# Patient Record
Sex: Male | Born: 1970
Health system: Southern US, Community
[De-identification: ages and names within clinical notes are randomized; demographics above are authoritative.]

## PROBLEM LIST (undated history)

## (undated) DIAGNOSIS — E785 Hyperlipidemia, unspecified: Secondary | ICD-10-CM

## (undated) DIAGNOSIS — B1910 Unspecified viral hepatitis B without hepatic coma: Secondary | ICD-10-CM

## (undated) DIAGNOSIS — E559 Vitamin D deficiency, unspecified: Secondary | ICD-10-CM

## (undated) DIAGNOSIS — K859 Acute pancreatitis without necrosis or infection, unspecified: Secondary | ICD-10-CM

## (undated) DIAGNOSIS — K219 Gastro-esophageal reflux disease without esophagitis: Secondary | ICD-10-CM

## (undated) DIAGNOSIS — D136 Benign neoplasm of pancreas: Secondary | ICD-10-CM

## (undated) HISTORY — DX: Unspecified viral hepatitis B without hepatic coma: B19.10

## (undated) HISTORY — DX: Vitamin D deficiency, unspecified: E55.9

## (undated) HISTORY — PX: OTHER SURGICAL HISTORY: SHX169

## (undated) HISTORY — PX: HERNIA REPAIR: SHX51

## (undated) HISTORY — PX: CHOLECYSTECTOMY: SHX55

---

## 2004-02-04 ENCOUNTER — Encounter: Admission: RE | Admit: 2004-02-04 | Discharge: 2004-02-04 | Payer: Self-pay | Admitting: Specialist

## 2005-05-18 ENCOUNTER — Ambulatory Visit: Payer: Self-pay | Admitting: Gastroenterology

## 2005-09-22 ENCOUNTER — Ambulatory Visit: Payer: Self-pay | Admitting: Gastroenterology

## 2005-09-24 ENCOUNTER — Ambulatory Visit (HOSPITAL_COMMUNITY): Admission: RE | Admit: 2005-09-24 | Discharge: 2005-09-24 | Payer: Self-pay | Admitting: Gastroenterology

## 2005-10-14 ENCOUNTER — Ambulatory Visit (HOSPITAL_COMMUNITY): Admission: RE | Admit: 2005-10-14 | Discharge: 2005-10-17 | Payer: Self-pay | Admitting: General Surgery

## 2006-01-01 ENCOUNTER — Ambulatory Visit: Payer: Self-pay | Admitting: Gastroenterology

## 2006-01-04 ENCOUNTER — Ambulatory Visit (HOSPITAL_COMMUNITY): Admission: RE | Admit: 2006-01-04 | Discharge: 2006-01-04 | Payer: Self-pay | Admitting: Gastroenterology

## 2006-07-04 ENCOUNTER — Encounter: Payer: Self-pay | Admitting: Gastroenterology

## 2006-07-26 ENCOUNTER — Ambulatory Visit: Payer: Self-pay | Admitting: Gastroenterology

## 2006-08-05 ENCOUNTER — Encounter: Admission: RE | Admit: 2006-08-05 | Discharge: 2006-08-05 | Payer: Self-pay | Admitting: Gastroenterology

## 2006-09-13 ENCOUNTER — Ambulatory Visit: Payer: Self-pay | Admitting: Gastroenterology

## 2007-07-05 ENCOUNTER — Ambulatory Visit: Payer: Self-pay | Admitting: Gastroenterology

## 2007-07-05 LAB — CONVERTED CEMR LAB
ALT: 24 units/L (ref 0–53)
AST: 22 units/L (ref 0–37)
Albumin: 4 g/dL (ref 3.5–5.2)
Alkaline Phosphatase: 74 units/L (ref 39–117)
Ammonia: 17 umol/L (ref 11–35)
BUN: 16 mg/dL (ref 6–23)
Basophils Absolute: 0 10*3/uL (ref 0.0–0.1)
Basophils Relative: 0.3 % (ref 0.0–1.0)
Bilirubin, Direct: 0.1 mg/dL (ref 0.0–0.3)
CO2: 31 meq/L (ref 19–32)
Calcium: 9.3 mg/dL (ref 8.4–10.5)
Chloride: 105 meq/L (ref 96–112)
Creatinine, Ser: 0.8 mg/dL (ref 0.4–1.5)
Eosinophils Absolute: 0.2 10*3/uL (ref 0.0–0.6)
Eosinophils Relative: 3 % (ref 0.0–5.0)
GFR calc Af Amer: 141 mL/min
GFR calc non Af Amer: 117 mL/min
Glucose, Bld: 104 mg/dL — ABNORMAL HIGH (ref 70–99)
HCT: 37.7 % — ABNORMAL LOW (ref 39.0–52.0)
Hemoglobin: 12.9 g/dL — ABNORMAL LOW (ref 13.0–17.0)
Lipase: 43 units/L (ref 11.0–59.0)
Lymphocytes Relative: 35.1 % (ref 12.0–46.0)
MCHC: 34.2 g/dL (ref 30.0–36.0)
MCV: 91.6 fL (ref 78.0–100.0)
Monocytes Absolute: 0.6 10*3/uL (ref 0.2–0.7)
Monocytes Relative: 8.1 % (ref 3.0–11.0)
Neutro Abs: 3.7 10*3/uL (ref 1.4–7.7)
Neutrophils Relative %: 53.5 % (ref 43.0–77.0)
Platelets: 331 10*3/uL (ref 150–400)
Potassium: 3.9 meq/L (ref 3.5–5.1)
RBC: 4.11 M/uL — ABNORMAL LOW (ref 4.22–5.81)
RDW: 11.2 % — ABNORMAL LOW (ref 11.5–14.6)
Sodium: 140 meq/L (ref 135–145)
TSH: 1.02 microintl units/mL (ref 0.35–5.50)
Total Bilirubin: 0.9 mg/dL (ref 0.3–1.2)
Total Protein: 7.1 g/dL (ref 6.0–8.3)
WBC: 7 10*3/uL (ref 4.5–10.5)

## 2007-07-07 ENCOUNTER — Encounter: Admission: RE | Admit: 2007-07-07 | Discharge: 2007-07-07 | Payer: Self-pay | Admitting: Gastroenterology

## 2007-07-13 ENCOUNTER — Ambulatory Visit: Payer: Self-pay | Admitting: Gastroenterology

## 2007-07-14 ENCOUNTER — Ambulatory Visit: Payer: Self-pay | Admitting: Gastroenterology

## 2007-07-20 ENCOUNTER — Ambulatory Visit (HOSPITAL_COMMUNITY): Admission: RE | Admit: 2007-07-20 | Discharge: 2007-07-20 | Payer: Self-pay | Admitting: Gastroenterology

## 2007-09-05 ENCOUNTER — Ambulatory Visit: Payer: Self-pay | Admitting: Gastroenterology

## 2007-11-30 ENCOUNTER — Encounter: Payer: Self-pay | Admitting: Gastroenterology

## 2007-12-16 ENCOUNTER — Encounter: Payer: Self-pay | Admitting: Gastroenterology

## 2008-01-24 ENCOUNTER — Encounter: Payer: Self-pay | Admitting: Gastroenterology

## 2008-02-08 ENCOUNTER — Encounter: Payer: Self-pay | Admitting: Gastroenterology

## 2008-03-30 DIAGNOSIS — K439 Ventral hernia without obstruction or gangrene: Secondary | ICD-10-CM

## 2008-05-15 ENCOUNTER — Telehealth: Payer: Self-pay | Admitting: Gastroenterology

## 2008-05-22 ENCOUNTER — Telehealth: Payer: Self-pay | Admitting: Gastroenterology

## 2008-07-06 ENCOUNTER — Ambulatory Visit: Payer: Self-pay | Admitting: Gastroenterology

## 2008-07-06 DIAGNOSIS — K859 Acute pancreatitis without necrosis or infection, unspecified: Secondary | ICD-10-CM | POA: Insufficient documentation

## 2008-07-06 DIAGNOSIS — K3184 Gastroparesis: Secondary | ICD-10-CM

## 2008-07-06 LAB — CONVERTED CEMR LAB
ALT: 39 units/L (ref 0–53)
AST: 28 units/L (ref 0–37)
Albumin: 4.2 g/dL (ref 3.5–5.2)
Alkaline Phosphatase: 82 units/L (ref 39–117)
Amylase: 50 units/L (ref 27–131)
BUN: 16 mg/dL (ref 6–23)
Basophils Absolute: 0 10*3/uL (ref 0.0–0.1)
Basophils Relative: 0.1 % (ref 0.0–1.0)
CO2: 30 meq/L (ref 19–32)
Calcium: 9.2 mg/dL (ref 8.4–10.5)
Chloride: 101 meq/L (ref 96–112)
Creatinine, Ser: 0.9 mg/dL (ref 0.4–1.5)
Eosinophils Absolute: 0.2 10*3/uL (ref 0.0–0.7)
Eosinophils Relative: 3.1 % (ref 0.0–5.0)
GFR calc Af Amer: 123 mL/min
GFR calc non Af Amer: 101 mL/min
Glucose, Bld: 165 mg/dL — ABNORMAL HIGH (ref 70–99)
HCT: 37.7 % — ABNORMAL LOW (ref 39.0–52.0)
Hemoglobin: 13 g/dL (ref 13.0–17.0)
Lymphocytes Relative: 34.3 % (ref 12.0–46.0)
MCHC: 34.4 g/dL (ref 30.0–36.0)
MCV: 92.7 fL (ref 78.0–100.0)
Monocytes Absolute: 0.5 10*3/uL (ref 0.1–1.0)
Monocytes Relative: 6.7 % (ref 3.0–12.0)
Neutro Abs: 4 10*3/uL (ref 1.4–7.7)
Neutrophils Relative %: 55.8 % (ref 43.0–77.0)
Platelets: 322 10*3/uL (ref 150–400)
Potassium: 3.9 meq/L (ref 3.5–5.1)
RBC: 4.06 M/uL — ABNORMAL LOW (ref 4.22–5.81)
RDW: 11.1 % — ABNORMAL LOW (ref 11.5–14.6)
Sodium: 138 meq/L (ref 135–145)
Total Bilirubin: 0.8 mg/dL (ref 0.3–1.2)
Total Protein: 7 g/dL (ref 6.0–8.3)
WBC: 7.2 10*3/uL (ref 4.5–10.5)

## 2008-07-10 ENCOUNTER — Telehealth (INDEPENDENT_AMBULATORY_CARE_PROVIDER_SITE_OTHER): Payer: Self-pay | Admitting: *Deleted

## 2008-07-12 ENCOUNTER — Telehealth: Payer: Self-pay | Admitting: Gastroenterology

## 2008-07-24 ENCOUNTER — Telehealth: Payer: Self-pay | Admitting: Gastroenterology

## 2008-07-24 ENCOUNTER — Ambulatory Visit (HOSPITAL_COMMUNITY): Admission: RE | Admit: 2008-07-24 | Discharge: 2008-07-24 | Payer: Self-pay | Admitting: Gastroenterology

## 2008-09-17 ENCOUNTER — Telehealth: Payer: Self-pay | Admitting: Gastroenterology

## 2009-02-21 ENCOUNTER — Telehealth: Payer: Self-pay | Admitting: Gastroenterology

## 2009-06-17 ENCOUNTER — Encounter: Payer: Self-pay | Admitting: Gastroenterology

## 2009-08-05 ENCOUNTER — Ambulatory Visit: Payer: Self-pay | Admitting: Gastroenterology

## 2009-08-05 DIAGNOSIS — R945 Abnormal results of liver function studies: Secondary | ICD-10-CM | POA: Insufficient documentation

## 2009-08-05 LAB — CONVERTED CEMR LAB
Anti Nuclear Antibody(ANA): NEGATIVE
Ceruloplasmin: 43 mg/dL (ref 21–63)
Ferritin: 507.4 ng/mL — ABNORMAL HIGH (ref 22.0–322.0)
HCV Ab: NEGATIVE
Hep B S Ab: POSITIVE — AB
Hepatitis B Surface Ag: NEGATIVE
INR: 1.1 — ABNORMAL HIGH (ref 0.8–1.0)
Iron: 85 ug/dL (ref 42–165)
Prothrombin Time: 11.2 s (ref 9.1–11.7)
Saturation Ratios: 26.2 % (ref 20.0–50.0)
Transferrin: 231.9 mg/dL (ref 212.0–360.0)
aPTT: 30.5 s — ABNORMAL HIGH (ref 21.7–28.8)

## 2009-08-06 ENCOUNTER — Telehealth: Payer: Self-pay | Admitting: Gastroenterology

## 2009-08-07 ENCOUNTER — Telehealth: Payer: Self-pay | Admitting: Gastroenterology

## 2009-08-12 ENCOUNTER — Encounter: Payer: Self-pay | Admitting: Gastroenterology

## 2009-08-15 ENCOUNTER — Ambulatory Visit: Payer: Self-pay | Admitting: Cardiology

## 2009-08-16 ENCOUNTER — Ambulatory Visit: Payer: Self-pay | Admitting: Gastroenterology

## 2009-11-18 ENCOUNTER — Telehealth: Payer: Self-pay | Admitting: Gastroenterology

## 2009-11-25 ENCOUNTER — Ambulatory Visit: Payer: Self-pay | Admitting: Gastroenterology

## 2009-11-26 LAB — CONVERTED CEMR LAB
ALT: 102 units/L — ABNORMAL HIGH (ref 0–53)
AST: 59 units/L — ABNORMAL HIGH (ref 0–37)
Albumin: 4.2 g/dL (ref 3.5–5.2)
Alkaline Phosphatase: 80 units/L (ref 39–117)
Bilirubin, Direct: 0.1 mg/dL (ref 0.0–0.3)
INR: 1.1 — ABNORMAL HIGH (ref 0.8–1.0)
Prothrombin Time: 11.5 s (ref 9.1–11.7)
Total Bilirubin: 1.1 mg/dL (ref 0.3–1.2)
Total Protein: 7.5 g/dL (ref 6.0–8.3)

## 2009-12-30 ENCOUNTER — Telehealth: Payer: Self-pay | Admitting: Gastroenterology

## 2010-01-09 ENCOUNTER — Encounter: Payer: Self-pay | Admitting: Gastroenterology

## 2010-01-10 ENCOUNTER — Ambulatory Visit: Payer: Self-pay | Admitting: Gastroenterology

## 2010-01-10 LAB — CONVERTED CEMR LAB
ALT: 31 units/L (ref 0–53)
AST: 27 units/L (ref 0–37)
Albumin: 4.2 g/dL (ref 3.5–5.2)
Alkaline Phosphatase: 82 units/L (ref 39–117)
Bilirubin, Direct: 0.1 mg/dL (ref 0.0–0.3)
Indirect Bilirubin: 0.3 mg/dL (ref 0.0–0.9)
Total Bilirubin: 0.4 mg/dL (ref 0.3–1.2)
Total Protein: 7.5 g/dL (ref 6.0–8.3)

## 2010-01-14 ENCOUNTER — Encounter: Payer: Self-pay | Admitting: Gastroenterology

## 2010-03-05 ENCOUNTER — Telehealth: Payer: Self-pay | Admitting: Gastroenterology

## 2010-05-28 IMAGING — CT CT ABDOMEN W/O CM
2 of 4 series · 17 of 46 positions shown, 19 images · non-contrast
Comparison: No prior CT.

CLINICAL DATA: Elevated liver function tests.  History
pancreatitis and pancreatic pseudocyst.  IV contrast allergy.

CT ABDOMEN WITHOUT CONTRAST
TECHNIQUE: Multidetector CT imaging of the abdomen was performed
following the standard protocol without IV contrast.

[Series 2: ap without · axial · non-contrast · 0.68mm/px · z∈[-290,-15]mm · 14 of 61 slices shown, 16 images]
[im 3/61  soft-tissue]
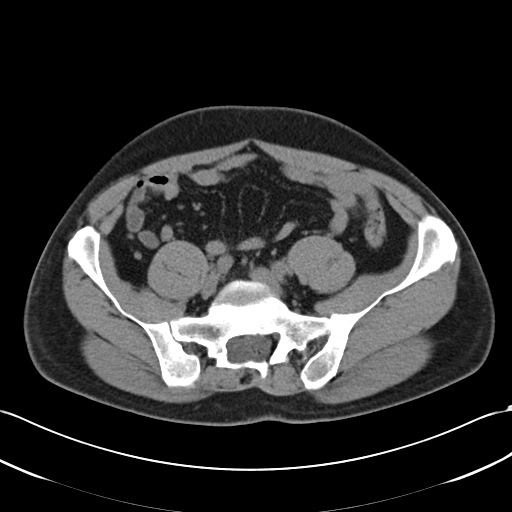
[im 3/61  bone]
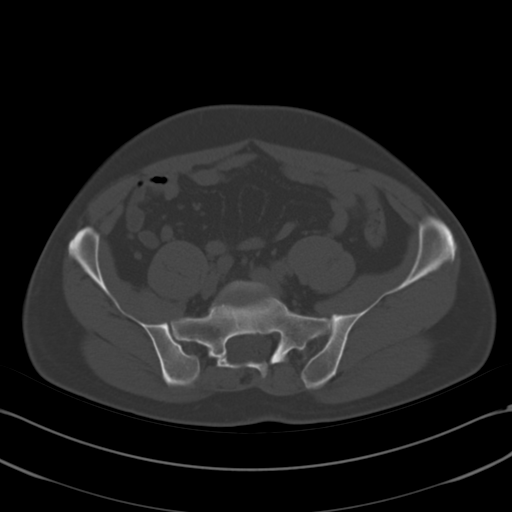
[im 8/61  soft-tissue]
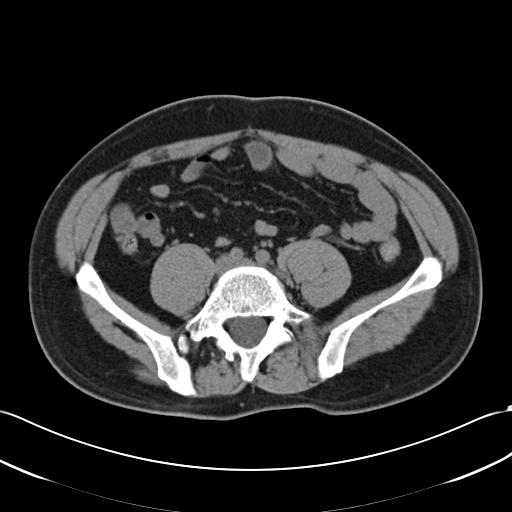
[im 11/61  soft-tissue]
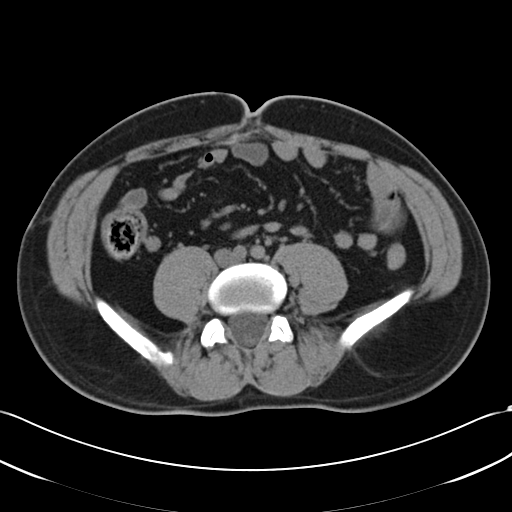
[im 16/61  soft-tissue]
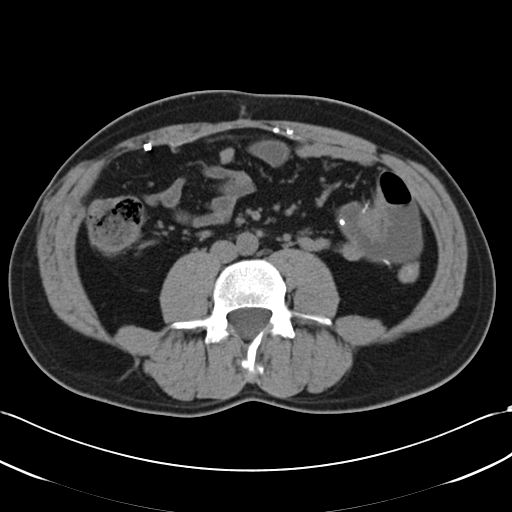
[im 21/61  soft-tissue]
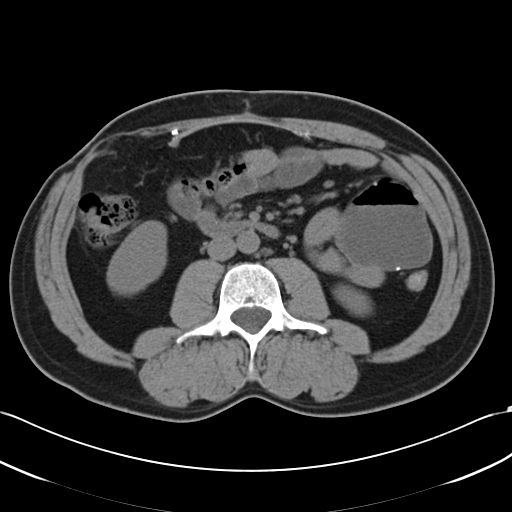
[im 24/61  soft-tissue]
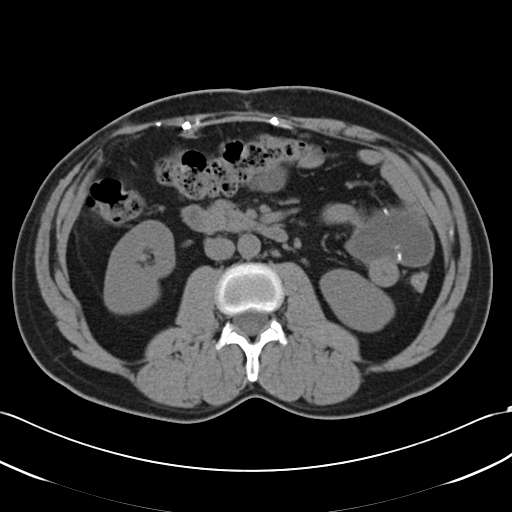
[im 29/61  soft-tissue]
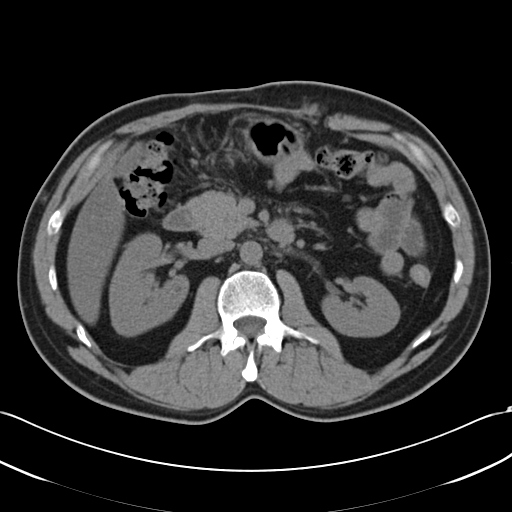
[im 32/61  soft-tissue]
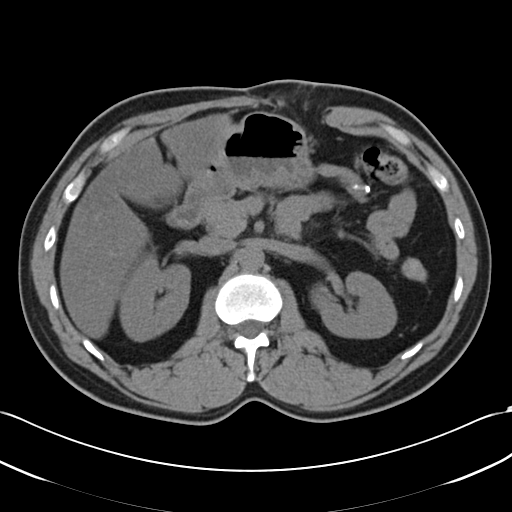
[im 37/61  soft-tissue]
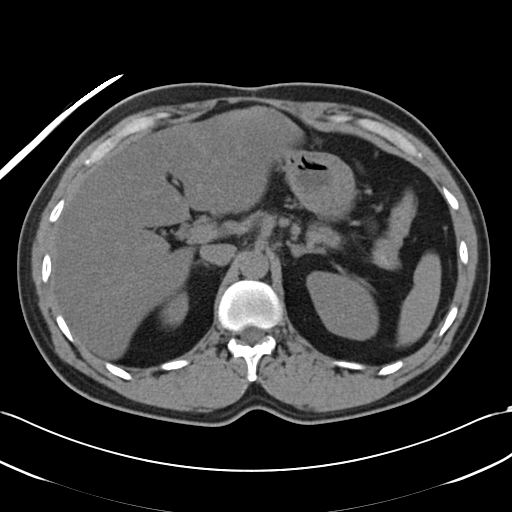
[im 37/61  bone]
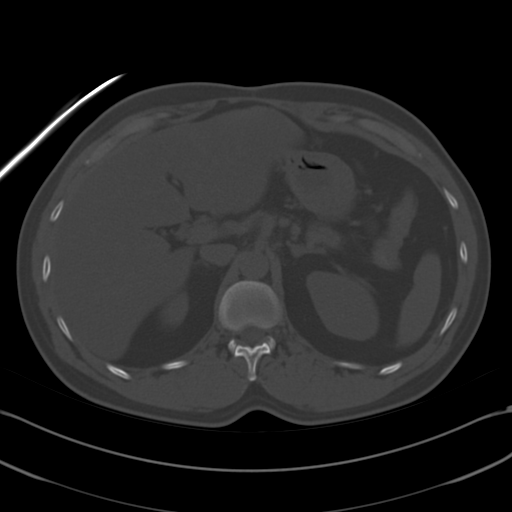
[im 40/61  soft-tissue]
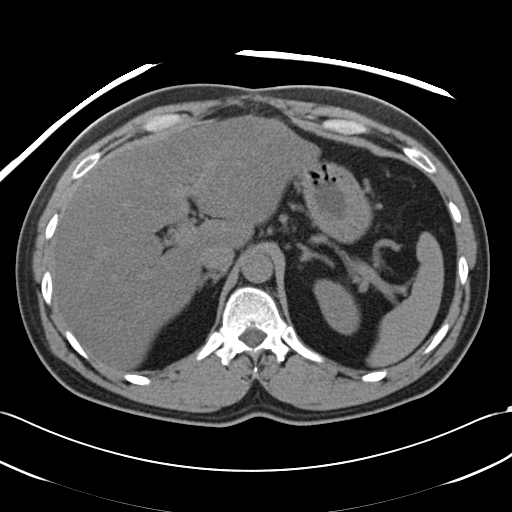
[im 45/61  soft-tissue]
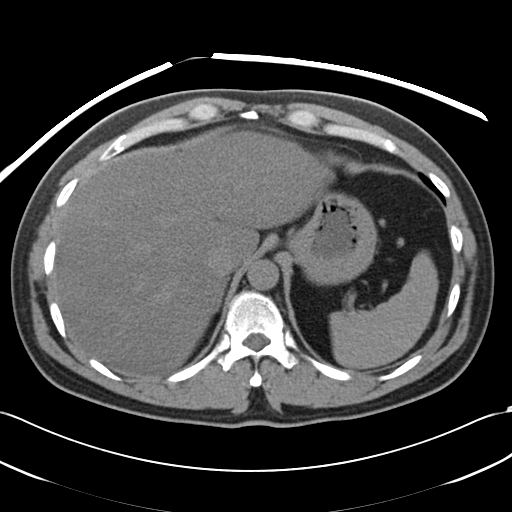
[im 50/61  soft-tissue]
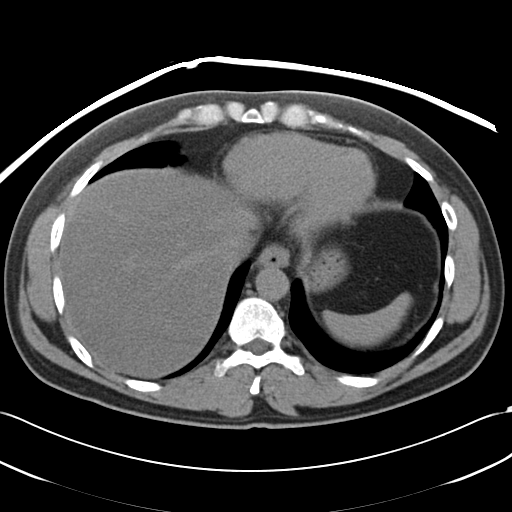
[im 53/61  soft-tissue]
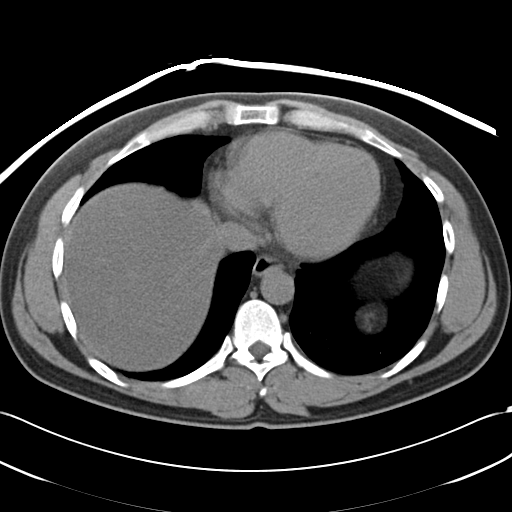
[im 58/61  soft-tissue]
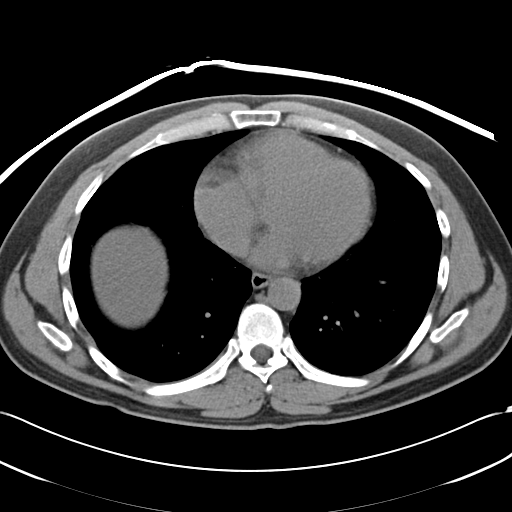

[Series 602: <mpr thick range> · coronal · 0.68mm/px · 3 of 126 slices shown]
[im 42/126  soft-tissue]
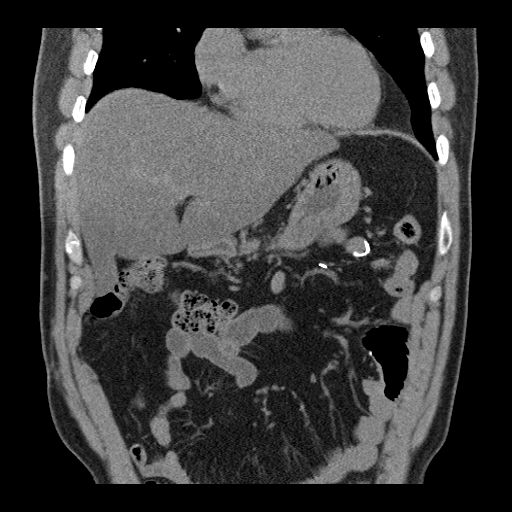
[im 56/126  soft-tissue]
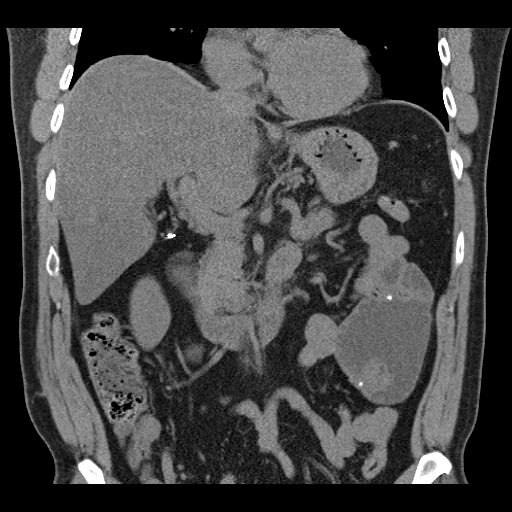
[im 70/126  soft-tissue]
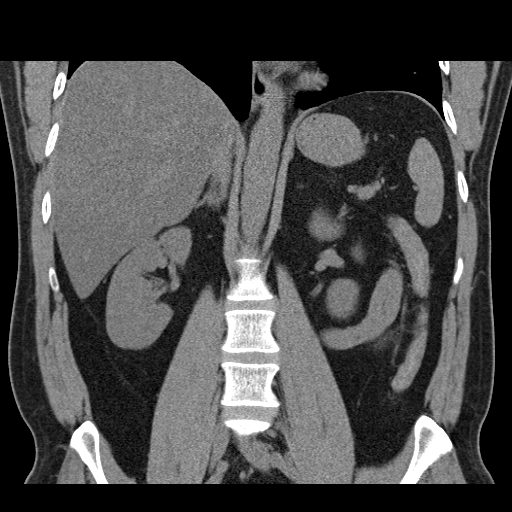

[17 of 46 positions shown; findings below may reference images not displayed]

FINDINGS: Diffuse fatty infiltration of liver is noted.  No focal
liver lesions identified on this noncontrast study.  Surgical clips
are seen from prior cholecystectomy.  There is no evidence of
biliary ductal dilatation.

Diffuse pancreatic atrophy is noted.  There is no evidence of
pancreatic mass or inflammatory changes.  No abnormal fluid
collections are identified.  Previous surgical clips is seen in the
upper abdomen as well as surgical mesh in the anterior abdominal
wall.  The other abdominal parenchymal organs are unremarkable in
appearance on this noncontrast study.  No mass or inflammatory
process identified.  There is no evidence of dilated bowel loops.
IMPRESSION: 1.  Diffuse fatty infiltration of the liver.
2.  Diffuse pancreatic atrophy and postoperative changes.  No acute
findings within the abdomen.

## 2010-10-15 ENCOUNTER — Telehealth: Payer: Self-pay | Admitting: Gastroenterology

## 2011-01-18 ENCOUNTER — Encounter: Payer: Self-pay | Admitting: Gastroenterology

## 2011-01-27 NOTE — Letter (Signed)
Summary: Generic Letter  [facility]  [address] [place], [place] [address]   Phone: [phone]  Fax: [phone]    01/14/2010  Cole Craig [address] [place], [place]  [address]  Dear [name],  Your recent liver tests were entirely normal.  No further workup is required at this time.  Feel free to contact me in you have any further questions.           Sincerely,   Melvia Heaps MD  Appended Document: Generic Letter letter mailed

## 2011-01-27 NOTE — Progress Notes (Signed)
Summary: Question about meds/INDIA TRIP  Phone Note Call from Patient Call back at [facility] Phone [phone]   Call For: Dr Arlyce Dice Reason for Call: Talk to Nurse Summary of Call: Questions about meds Initial call taken by: Leanor Kail [facility],  October 15, 2010 11:04 AM  Follow-up for Phone Call        Returned pt call. No Answer L/M Follow-up by: Merri Ray CMA Duncan Dull),  October 15, 2010 11:10 AM  Additional Follow-up for Phone Call Additional follow up Details #1::        Tried to contact pt again at number given [phone]. Still no answer. Additional Follow-up by: Merri Ray CMA Duncan Dull),  October 15, 2010 12:48 PM    Additional Follow-up for Phone Call Additional follow up Details #2::    Spoke with pt, wants refill of Protonix early, Will call [facility] to get overide so pt can get his refill early because [place] is [place] 1 [place] . [facility] [employer] , They are expiditeing his order today should have it within 7 days     Also Dr Arlyce Dice, Pt wanted to ask you if he should be concered about anything with his health while in [place] for 1 month. Want a call back... Follow-up by: Merri Ray CMA Duncan Dull),  October 16, 2010 2:18 PM  Additional Follow-up for Phone Call Additional follow up Details #3:: Details for Additional Follow-up Action Taken: ok to travel   Pt informed Additional Follow-up by: Louis Meckel MD,  October 17, 2010 8:36 AM

## 2011-01-27 NOTE — Progress Notes (Signed)
Summary: Protonix refill  Phone Note Call from Patient Call back at [facility] Phone [phone]   Call For: Dr Arlyce Dice Reason for Call: Refill Medication Summary of Call: Needs a presciption for Protonix sent to [facility] for 29m supply. Can we fax to them directly? Initial call taken by: Leanor Kail [facility],  March 05, 2010 10:26 AM  Follow-up for Phone Call        Called pt to inform sent in med gave pt samples until pts rx comes in Follow-up by: Merri Ray CMA Duncan Dull),  March 05, 2010 10:41 AM    New/Updated Medications: PROTONIX 40 MG  TBEC (PANTOPRAZOLE SODIUM) 1 each day 30 minutes before meal Prescriptions: PROTONIX 40 MG  TBEC (PANTOPRAZOLE SODIUM) 1 each day 30 minutes before meal  #90 x 4   Entered by:   Merri Ray CMA (AAMA)   Authorized by:   Louis Meckel MD   Signed by:   Merri Ray CMA (AAMA) on 03/05/2010   Method used:   Faxed to ...       [facility] (mail-order)       [address] [place], [place]  [address]       Ph: [phone]       Fax: [phone]   RxID:   [id number]

## 2011-01-27 NOTE — Progress Notes (Signed)
Summary: F/U lab reminder 6 week  Phone Note Outgoing Call Call back at [facility] Phone [phone]   Call placed by: Merri Ray CMA Duncan Dull),  December 30, 2009 10:45 AM Action Taken: Appt scheduled Summary of Call: Called to remind pt to have follow up labs drawn this week. Stated he would be in this week to have them drawn. Initial call taken by: Merri Ray CMA (AAMA),  December 30, 2009 10:45 AM

## 2011-01-27 NOTE — Letter (Signed)
Summary: Appointment Reminder  2nd reminder  [facility]  [address] [place], [place] [address]   Phone: [phone]  Fax: [phone]        January 09, 2010 MRN: [id number]    MARLO GOODRICH [address] [place], [place]  [address]    Dear Mr. BEIRNE,   This is your second reminder to have your follow up labs drawn as   discussed previously, recommended for you by Dr. Arlyce Dice. It is very   important that you have your follow up labs drawn. We hope that you  allow [place] to participate in your health care needs. Please contact us at  [phone] at your earliest convenience if you have any questions.     Sincerely,    Merri Ray CMA (AAMA)

## 2011-05-12 NOTE — Assessment & Plan Note (Signed)
[name]                         GASTROENTEROLOGY OFFICE NOTE   Cole Craig, Cole Craig Cole Craig                       MRN:          [id number]  DATE:09/05/2007                            DOB:          [birth year]    PROBLEM:  1. Abdominal discomfort.  2. History of pancreatitis.   Cole Craig has returned for scheduled followup.  Upper endoscopy was  entirely unremarkable.  Gastrografin emptying scan was abnormal,  demonstrating 33% emptying at 2 hours, with normal retention less than  30.  On Reglan, his pain has improved.  He does have mild postprandial  left shoulder pain.  History is also pertinent for idiopathic  pancreatitis complicated by a small pseudocyst.   PHYSICAL EXAMINATION:  VITAL SIGNS:  Pulse 72, blood pressure 98/62,  weight 144.   IMPRESSION:  1. Mild gastroparesis.  This may be the cause of his abdominal and      shoulder pain.  2. History of necrotizing pancreatitis.   RECOMMENDATIONS:  Continue current regimen.     Barbette Hair. Arlyce Dice, MD,FACG  Electronically Signed    RDK/MedQ  DD: 09/05/2007  DT: 09/06/2007  Job #: [id number]

## 2011-05-12 NOTE — Assessment & Plan Note (Signed)
[facility]                         GASTROENTEROLOGY OFFICE NOTE   [name], [name] CASSELL VOORHIES                       MRN:          [id number]  DATE:07/13/2007                            DOB:          Jun 26, 1971    PROBLEM:  Abdominal pain.   Mr. Cole Craig has returned for re-evaluation.  Lab work was unremarkable.  Followup MRI demonstrated decreased size in the pancreatic pseudocyst.  There was a question of bowel intussusception.  Mr. Cole Craig states that  he has post prandial discomfort in his left shoulder with radiation down  his arm.  He also has some fullness post prandially and an uneasy  feeling that lasts for several hours.  He remains on Protonix.   EXAMINATION:  Pulse 60, blood pressure 96/64, weight 138.   IMPRESSION:  Postprandial fullness and discomfort.  This at least raises  the question of perhaps a partial gastric outlet obstruction though the  question of an intussusception was raised by MRI it does fit clinically.   RECOMMENDATIONS:  Upper endoscopy.  Patient's wife states that he has  had difficulty with sedation in the past.  I will premedicate him with  Benadryl in anticipation of this.     Barbette Hair. Arlyce Dice, MD,FACG  Electronically Signed    RDK/MedQ  DD: 07/13/2007  DT: 07/13/2007  Job #: [id number]

## 2011-05-12 NOTE — Assessment & Plan Note (Signed)
[facility]                         GASTROENTEROLOGY OFFICE NOTE   Cole Craig, Cole Craig                       MRN:          [id number]  DATE:07/05/2007                            DOB:          [birth year]    PROBLEM:  Abdominal pain.   Cole Craig has returned again complaining of abdominal pain.  Over the  past 3 days he has noted sharp, pressure-like discomfort in the left  upper quadrant radiating to his left chest.  He is without fever or  nausea.  The pain is reminiscent of his previous pancreatic pain.  He  has a history of necrotizing pancreatitis complicated by pancreatic  pseudocyst.  His last MRI in August ____2007______ demonstrated a 2 cm x  2 cm pseudocyst.   Only medication is Protonix.   EXAMINATION:  Pulse 72.  Blood pressure 96/66.  Weight 140.  HEENT: EOMI. PERRLA. Sclerae are anicteric.  Conjunctivae are pink.  NECK:  Supple without thyromegaly, adenopathy or carotid bruits.  CHEST:  Clear to auscultation and percussion without adventitious  sounds.  CARDIAC:  Regular rhythm; normal S1 S2.  There are no murmurs, gallops  or rubs.  ABDOMEN:  There is mild left upper quadrant tenderness without guarding  or rebound.  Bowel sounds are normoactive.  There are no abdominal  masses or organomegaly.  EXTREMITIES:  Full range of motion.  No cyanosis, clubbing or edema.  RECTAL:  There are no masses.  Stool is Hemoccult negative.   IMPRESSION:  Abdominal pain.  Symptoms could be related to recurrent  acute pancreatitis.  Enlarging pseudocyst is also a concern.   RECOMMENDATION:  1. Check CBC, amylase, and CMET.  2. Followup MRI of the pancreas.     Barbette Hair. Arlyce Dice, MD,FACG  Electronically Signed    RDK/MedQ  DD: 07/05/2007  DT: 07/06/2007  Job #: [id number]

## 2011-05-15 NOTE — Assessment & Plan Note (Signed)
[facility]                           GASTROENTEROLOGY OFFICE NOTE   English, Cole Craig                       MRN:          [id number]  DATE:07/26/2006                            DOB:          1971/06/23    PROBLEM:  1.  Left upper quadrant abdominal pain.  2.  Postprandial abdominal discomfort and nausea.  3.  History of severe necrotizing pancreatitis.   Cole Craig comes in today for followup, having last been seen about six months  ago.  He has a history of severe necrotizing pancreatitis approximately four  years ago which was complicated by pseudocysts and required open  debridement.  He last had MRI and MRCP January 04, 2006 which did show a  small pseudocyst in the body of the pancreas which had slightly increased in  size from 7 mm to 17 mm.  There was noted to be dilation of the pancreatic  duct distal to the pseudocyst with atrophy of the tail of the pancreas.  The  uncinate head and head of the pancreas appeared normal.  Biliary tree  appeared normal.   Cole Craig relates that he has been having frequent nausea which he notes at  nighttime before going to bed.  This is not usually associated with vomiting  but rather a queasiness.  He also complains of an uneasy, unsettled feeling  in his abdomen postprandially.  His bowel movements have been normal.  He  has not been experiencing any diarrhea and his weight is stable, actually up  8 pounds since his last office visit.  He describes some pressure and  discomfort in the left upper quadrant which he is aware of fairly  constantly.  He also said that he is sensitive to touch in the left upper  quadrant over his incision.  He says that he feels that he has had some  minor attacks with his pancreas over the past few months but nothing very  significant.  He is not consuming any regular alcohol, says he may have one  drink per month.   CURRENT MEDICATIONS:  1.  Nexium 40 mg q.a.m.  2.   Multivitamin daily.   ALLERGIES:  INTOLERANT TO IVP DYE WITH SWELLING OF THE TONGUE AND RASH.   EXAM:  Well-developed, thin, [place] male in no acute distress.  Weight is  149, blood pressure 100/78, pulse is 72.  CARDIOVASCULAR:  Regular rate and rhythm with S1 and S2.  PULMONARY:  Clear to A&P.  ABDOMEN:  Soft, bowel sounds are active.  He is mildly tender in the upper  abdomen.  There is a small defect in the upper left portion of his abdominal  incisional scar consistent with a small incisional hernia.  No mass or  hepatosplenomegaly.   IMPRESSION:  13.  A 40 year old male with history of severe necrotizing pancreatitis, now      with persistent left upper quadrant discomfort, nausea and postprandial      dyspepsia.  2.  Small pancreatic body pseudocyst which had enlarged at time of last MRI.   PLAN:  1.  Etiology of his nausea is  not clear.  I have asked him to stop his      Nexium in case this is playing a role and give a trial off of Nexium for      4 or 5 days.  If his nausea resolves, will let him try another PPI.  If      nausea persists, he is asked to go back on the Nexium at 40 mg q.a.m.  2.  Trial of Ultrase MT 20 two p.o. a.c. meals.  Was given samples and a      prescription today.  3.  Check CBC, lipase and hepatic panel.  4.  Schedule follow-up MRI of the abdomen.  5.  Return office visit in three to four weeks or sooner p.r.n.                                   Amy Esterwood, PA-C                                Robert D. Arlyce Dice, MD, [facility]   AE/MedQ  DD:  07/26/2006  DT:  07/27/2006  Job #:  [id number]

## 2011-05-15 NOTE — Op Note (Signed)
NAMEEFREN, KROSS NO.:  [id number]   MEDICAL RECORD NO.:  [id number]          PATIENT TYPE:  INP   LOCATION:  0005                         FACILITY:  [facility]   PHYSICIAN:  Sharlet Salina T. Hoxworth, M.D.DATE OF BIRTH:  [birth year]   DATE OF PROCEDURE:  10/14/2005  DATE OF DISCHARGE:                                 OPERATIVE REPORT   PREOPERATIVE DIAGNOSIS:  Ventral incisional hernia x2.   POSTOPERATIVE DIAGNOSIS:  Ventral incisional hernia x2.   SURGICAL PROCEDURES:  Repair of ventral incisional hernia x2.   SURGEON,:  Dr. Johna Sheriff.   ANESTHESIA:  General.   BRIEF HISTORY:  Mr. Depree is a 40 year old male with a history of  necrotizing pancreatitis of uncertain etiology treated in [place] about  two years ago. This required open abdominal pancreatic debridement and  subsequent readmission and laparotomy for large pancreatic pseudocyst. He is  essentially completely recovered from this illness. He has a large upper  midline incision and a large right Kocher incision and a drain site lateral  to the Kocher incision where a large sump drain had been placed. He recently  has developed painful bulges both at the sump drain site fairly far around  on the right flank and also at the low end of his upper midline incision  just above the umbilicus. Exam confirms incisional hernias in both these  areas. The remainder of his long incisions appear well healed without  hernias. Options have been discussed. We elected to proceed with open repair  of each of these areas.   PROCEDURE:  Indications, risks of bleeding, infection, and recurrence were  discussed and understood with the patient and his wife. He is now brought to  the operating room for these procedures.   DESCRIPTION OF PROCEDURE:  The patient was brought to the operating room,  placed in a supine position on the operating table, and general orotracheal  anesthesia was induced. He received preoperative  broad-spectrum antibiotics.  PAS were in place. It was widely sterilely prepped and draped. Correct  patient and procedure were verified. I initially approached the hernia site  and the right flank at the old sump drain site. This was distal or a little  bit lateral to the end of the Kocher incision. I extended this incision  laterally, and dissection was carried down through subcutaneous tissue using  cautery. Hernia sac was identified and opened, and it contained omentum. The  hernia sac was debrided, and the subcu was cleared back off of the external  oblique in all directions. It became apparent that the hernia defect was  really quite from small, measuring probably less than a centimeter in  diameter. I resected the chronically incarcerated omentum, dividing this  between clamps and tying with 3-0 Vicryl ties, and reduced the remainder  through the small defect which was really about the size of my index finger.  The fascia underneath this was cleared back for a small distance with blunt  dissection. As this defect was so small, I elected to repair this with a  Prolene plug and onlay Prolene patch. A Prolene  plug was fashioned, placed  through the defect, and then the defect was closed and the plug incorporated  with two or three interrupted 0 Prolene sutures. An onlay Prolene patch  about 5 cm in diameter was then used and sutured at its periphery to the  external oblique fascia with interrupted 2-0 Vicryl sutures. This appeared  to provide broad coverage. The wound was irrigated. Soft tissue was  infiltrated with Marcaine. The skin was closed with staples. Attention was  then turned to the midline hernia. I opened the lower portion of previous  midline incision, and hernia sac was encountered containing omentum. This  defect was actually somewhat larger measuring about 2.5 cm in diameter. I  extended the incision down periumbilically, and fascial edges were defined.  The hernia sac  was opened and the omentum dissected off the hernia sac down  into the abdominal cavity. The fascial defect was extended slightly about  half a centimeter superiorly and inferiorly in preparation for using a Kugel  __________  intra-abdominal hernia system. Omental adhesions were cleared  from the anterior abdominal wall back for about 5 or 6 cm in all directions.  There were no bowel adhesions. Following this, a 4.5-inch circular Kugel  patch was used rolled, placed into the abdomen, unfurled with very nice  deployment and wide coverage back in all directions from the defect. The  patch was then secured at its periphery circumferentially with the Endo  tacker. Following this, the fascial defect, and hernia sac were closed over  the Kugel patch, incorporating the Prolene portion of the mesh in this  closure. Soft tissue was infiltrated with Marcaine. The subcu was irrigated.  Skin closed with staples.   Sponge and needle counts were correct. Dressings were applied. The patient  taken to recovery in good condition.      Lorne Skeens. Hoxworth, M.D.  Electronically Signed     BTH/MEDQ  D:  10/14/2005  T:  10/14/2005  Job:  [id number]

## 2011-05-15 NOTE — Assessment & Plan Note (Signed)
[facility]                           GASTROENTEROLOGY OFFICE NOTE   Cole, Skeet SHAYON Craig                       MRN:          [id number]  DATE:09/13/2006                            DOB:          September 09, 1971    PROBLEM:  Abdominal pain.   REASON FOR VISIT:  Cole Craig has returned for scheduled GI followup.  He  continues to complain of a very nonspecific uneasiness in his left upper  quadrant.  It has actually improved since stopping Nexium, although his  heartburn has returned.  A lab including CBC and LFTs were normal.  Lipase  was at the upper limit of normal.  MRI demonstrated a very slight  enlargement of his pseudocyst now measuring approximately 2.7 x 2.2 cm.  He  also has atrophy of the pancreatic body and tail.  It appears that the  pseudocyst communicates with the pancreatic duct.   PHYSICAL EXAMINATION:  VITAL SIGNS:  Pulse 80, blood pressure 110/70, weight  146.  HEENT:  EOMI. PERRLA. Sclerae are anicteric.  Conjunctivae are pink.  NECK:  Supple without thyromegaly, adenopathy or carotid bruits.  CHEST:  Clear to auscultation and percussion without adventitious sounds.  CARDIAC:  Regular rhythm; normal S1 S2.  There are no murmurs, gallops or  rubs.  ABDOMEN:  There are no abdominal masses or organomegaly.  There is minimal  tenderness in the area of the surgical incisions over his abdomen.  There  are no frank hernias.  EXTREMITIES:  Full range of motion.  No cyanosis, clubbing or edema.  RECTAL:  There are no masses.  Stool is Hemoccult negative.   IMPRESSION:  1. Nonspecific upper abdominal pain.  2. History of necrotizing pancreatitis complicated by a small pseudocyst.  3. Gastroesophageal reflux disease (GERD).   RECOMMENDATION:  1. I have given Mr. Hsiung samples of other PPIs.  2. Followup MRI in approximately 3-4 months.                                   Barbette Hair. Arlyce Dice, MD,FACG   RDK/MedQ  DD:  09/13/2006  DT:   09/14/2006  Job #:  [id number]

## 2012-03-15 ENCOUNTER — Encounter (HOSPITAL_COMMUNITY): Payer: Self-pay | Admitting: Emergency Medicine

## 2012-03-15 ENCOUNTER — Inpatient Hospital Stay (HOSPITAL_COMMUNITY)
Admission: EM | Admit: 2012-03-15 | Discharge: 2012-03-19 | DRG: 204 | Disposition: A | Discharge status: Home / Self Care | Payer: BC Managed Care – PPO | Attending: Family Medicine | Admitting: Family Medicine

## 2012-03-15 ENCOUNTER — Emergency Department (HOSPITAL_COMMUNITY): Payer: BC Managed Care – PPO

## 2012-03-15 DIAGNOSIS — E119 Type 2 diabetes mellitus without complications: Secondary | ICD-10-CM | POA: Diagnosis present

## 2012-03-15 DIAGNOSIS — Z87891 Personal history of nicotine dependence: Secondary | ICD-10-CM

## 2012-03-15 DIAGNOSIS — K859 Acute pancreatitis without necrosis or infection, unspecified: Principal | ICD-10-CM | POA: Diagnosis present

## 2012-03-15 DIAGNOSIS — R51 Headache: Secondary | ICD-10-CM | POA: Diagnosis present

## 2012-03-15 DIAGNOSIS — K760 Fatty (change of) liver, not elsewhere classified: Secondary | ICD-10-CM | POA: Diagnosis present

## 2012-03-15 DIAGNOSIS — K7689 Other specified diseases of liver: Secondary | ICD-10-CM | POA: Diagnosis present

## 2012-03-15 DIAGNOSIS — E785 Hyperlipidemia, unspecified: Secondary | ICD-10-CM | POA: Diagnosis present

## 2012-03-15 DIAGNOSIS — K5900 Constipation, unspecified: Secondary | ICD-10-CM | POA: Diagnosis present

## 2012-03-15 DIAGNOSIS — K219 Gastro-esophageal reflux disease without esophagitis: Secondary | ICD-10-CM | POA: Diagnosis present

## 2012-03-15 HISTORY — DX: Acute pancreatitis without necrosis or infection, unspecified: K85.90

## 2012-03-15 HISTORY — DX: Gastro-esophageal reflux disease without esophagitis: K21.9

## 2012-03-15 HISTORY — DX: Benign neoplasm of pancreas: D13.6

## 2012-03-15 HISTORY — DX: Hyperlipidemia, unspecified: E78.5

## 2012-03-15 LAB — COMPREHENSIVE METABOLIC PANEL
ALT: 60 U/L — ABNORMAL HIGH (ref 0–53)
AST: 38 U/L — ABNORMAL HIGH (ref 0–37)
Albumin: 4.5 g/dL (ref 3.5–5.2)
Alkaline Phosphatase: 99 U/L (ref 39–117)
BUN: 16 mg/dL (ref 6–23)
CO2: 28 mEq/L (ref 19–32)
Calcium: 9.6 mg/dL (ref 8.4–10.5)
Chloride: 96 mEq/L (ref 96–112)
Creatinine, Ser: 0.74 mg/dL (ref 0.50–1.35)
GFR calc Af Amer: >90 mL/min (ref >90)
GFR calc non Af Amer: >90 mL/min (ref >90)
Glucose, Bld: 158 mg/dL — ABNORMAL HIGH (ref 70–99)
Potassium: 3.9 mEq/L (ref 3.5–5.1)
Sodium: 133 mEq/L — ABNORMAL LOW (ref 135–145)
Total Bilirubin: 0.6 mg/dL (ref 0.3–1.2)
Total Protein: 7.9 g/dL (ref 6.0–8.3)

## 2012-03-15 LAB — URINALYSIS, ROUTINE W REFLEX MICROSCOPIC
Bilirubin Urine: NEGATIVE
Glucose, UA: NEGATIVE mg/dL
Hgb urine dipstick: NEGATIVE
Ketones, ur: NEGATIVE mg/dL
Leukocytes, UA: NEGATIVE
Nitrite: NEGATIVE
Protein, ur: NEGATIVE mg/dL
Specific Gravity, Urine: 1.025 (ref 1.005–1.030)
Urobilinogen, UA: 1 mg/dL (ref 0.0–1.0)
pH: 7 (ref 5.0–8.0)

## 2012-03-15 LAB — DIFFERENTIAL
Basophils Absolute: 0 10*3/uL (ref 0.0–0.1)
Basophils Relative: 0 % (ref 0–1)
Basophils Relative: 0 % (ref 0–1)
Eosinophils Absolute: 0.1 10*3/uL (ref 0.0–0.7)
Eosinophils Absolute: 0.1 10*3/uL (ref 0.0–0.7)
Eosinophils Relative: 1 % (ref 0–5)
Eosinophils Relative: 1 % (ref 0–5)
Lymphocytes Relative: 26 % (ref 12–46)
Lymphs Abs: 2.7 10*3/uL (ref 0.7–4.0)
Monocytes Absolute: 0.6 10*3/uL (ref 0.1–1.0)
Monocytes Absolute: 0.6 10*3/uL (ref 0.1–1.0)
Monocytes Relative: 5 % (ref 3–12)
Monocytes Relative: 5 % (ref 3–12)
Neutro Abs: 7.1 10*3/uL (ref 1.7–7.7)
Neutro Abs: 7.1 10*3/uL (ref 1.7–7.7)
Neutrophils Relative %: 68 % (ref 43–77)
nRBC: 0 /100 WBC

## 2012-03-15 LAB — CBC
HCT: 40.5 % (ref 39.0–52.0)
Hemoglobin: 13.6 g/dL (ref 13.0–17.0)
MCH: 30.4 pg (ref 26.0–34.0)
MCHC: 33.6 g/dL (ref 30.0–36.0)
MCV: 90.4 fL (ref 78.0–100.0)
Platelets: 334 10*3/uL (ref 150–400)
RBC: 4.48 MIL/uL (ref 4.22–5.81)
RDW: 11.3 % — ABNORMAL LOW (ref 11.5–15.5)
WBC: 10.4 10*3/uL (ref 4.0–10.5)

## 2012-03-15 LAB — LIPID PANEL
HDL: 39 mg/dL — ABNORMAL LOW (ref >39)
Total CHOL/HDL Ratio: 4.2 RATIO
VLDL: 24 mg/dL (ref 0–40)

## 2012-03-15 LAB — LACTATE DEHYDROGENASE: LDH: 170 U/L (ref 94–250)

## 2012-03-15 LAB — LIPASE, BLOOD: Lipase: >3000 U/L — ABNORMAL HIGH (ref 11–59)

## 2012-03-15 LAB — HEMOGLOBIN A1C: Hgb A1c MFr Bld: 6 % — ABNORMAL HIGH (ref <5.7)

## 2012-03-15 LAB — GLUCOSE, CAPILLARY

## 2012-03-15 MED ORDER — HYDROMORPHONE HCL PF 1 MG/ML IJ SOLN
1.0000 mg | Freq: Once | INTRAMUSCULAR | Status: DC
  Administered 2012-03-15: 1 mg via INTRAVENOUS
  Filled 2012-03-15: qty 1

## 2012-03-15 MED ORDER — INSULIN ASPART 100 UNIT/ML ~~LOC~~ SOLN: 0.0000 [IU] | Freq: Three times a day (TID) | SUBCUTANEOUS | Status: DC

## 2012-03-15 MED ORDER — PANTOPRAZOLE SODIUM 40 MG IV SOLR
40.0000 mg | Freq: Once | INTRAVENOUS | Status: AC
  Administered 2012-03-15: 40 mg via INTRAVENOUS
  Filled 2012-03-15: qty 40

## 2012-03-15 MED ORDER — ONDANSETRON HCL 4 MG/2ML IJ SOLN
4.0000 mg | Freq: Four times a day (QID) | INTRAMUSCULAR | Status: DC | PRN
  Administered 2012-03-15 – 2012-03-16 (×4): 4 mg via INTRAVENOUS
  Filled 2012-03-15 (×4): qty 2

## 2012-03-15 MED ORDER — FENTANYL CITRATE 0.05 MG/ML IJ SOLN
50.0000 ug | Freq: Once | INTRAMUSCULAR | Status: AC
  Administered 2012-03-15: 50 ug via INTRAVENOUS

## 2012-03-15 MED ORDER — PROMETHAZINE HCL 25 MG PO TABS
25.0000 mg | ORAL_TABLET | Freq: Four times a day (QID) | ORAL | Status: DC | PRN
  Filled 2012-03-15: qty 1

## 2012-03-15 MED ORDER — ONDANSETRON HCL 4 MG/2ML IJ SOLN
4.0000 mg | Freq: Once | INTRAMUSCULAR | Status: AC
  Administered 2012-03-15: 4 mg via INTRAVENOUS

## 2012-03-15 MED ORDER — SODIUM CHLORIDE 0.9 % IV SOLN
INTRAVENOUS | Status: DC
  Administered 2012-03-15 – 2012-03-16 (×3): via INTRAVENOUS

## 2012-03-15 MED ORDER — HYDROMORPHONE HCL PF 1 MG/ML IJ SOLN
1.0000 mg | INTRAMUSCULAR | Status: DC | PRN
  Administered 2012-03-15 – 2012-03-16 (×5): 1 mg via INTRAVENOUS
  Filled 2012-03-15 (×5): qty 1

## 2012-03-15 MED ORDER — FENTANYL CITRATE 0.05 MG/ML IJ SOLN
INTRAMUSCULAR | Status: AC
  Filled 2012-03-15: qty 2

## 2012-03-15 MED ORDER — HYDROMORPHONE HCL PF 1 MG/ML IJ SOLN
1.0000 mg | Freq: Once | INTRAMUSCULAR | Status: AC
  Administered 2012-03-15: 1 mg via INTRAVENOUS
  Filled 2012-03-15: qty 1

## 2012-03-15 MED ORDER — SODIUM CHLORIDE 0.9 % IV SOLN
Freq: Once | INTRAVENOUS | Status: AC
  Administered 2012-03-15: 04:00:00 via INTRAVENOUS

## 2012-03-15 MED ORDER — ONDANSETRON HCL 4 MG/2ML IJ SOLN
INTRAMUSCULAR | Status: AC
  Filled 2012-03-15: qty 2

## 2012-03-15 NOTE — ED Notes (Signed)
Pt states he is having abd pain that started yesterday and got worse after lunch  Pt states he did not eat yesterday evening  Pt is c/o pain to his upper abdomen that radiates into his back making it hard to breathe  Pt states the pain comes in waves  Pt is c/o nausea without vomiting at this time

## 2012-03-15 NOTE — ED Notes (Signed)
Attempted to call report to RN on 3 east, RN in a room with a patient will return call

## 2012-03-15 NOTE — Progress Notes (Signed)
Subjective: Patient is a 41 year old [place] male admitted for severe pancreatitis. He last had an episode 8 years ago, at that time of course complicated for necrotizing pancreatitis status post surgical treatment.  Patient seen later on in the afternoon after he was admitted early this morning. Stating his pain is better following medication recently given for nausea and pain. No other complaints.  Objective: Weight change:   Intake/Output Summary (Last 24 hours) at 03/15/12 1916 Last data filed at 03/15/12 1847  Gross per 24 hour  Intake   1500 ml  Output   1100 ml  Net    400 ml    Filed Vitals:   03/15/12 1753  BP: 113/77  Pulse: 74  Temp: 98.7 F (37.1 C)  Resp: 16   General: Alert and oriented x3, no acute distress, looks about stated age HEENT: Normocephalic and atraumatic extremities are dry Cardiovascular: Regular rate and rhythm, S1-S2 Lungs:" Bilaterally Abdomen: Soft, nontender, nondistended, hypoactive bowel sounds Extremities: No clubbing cyanosis or edema  Lab Results: Basic Metabolic Panel:  Basename 03/15/12 0352  NA 133*  K 3.9  CL 96  CO2 28  GLUCOSE 158*  BUN 16  CREATININE 0.74  CALCIUM 9.6  MG --  PHOS --   Liver Function Tests:  [facility] 03/15/12 0352  AST 38*  ALT 60*  ALKPHOS 99  BILITOT 0.6  PROT 7.9  ALBUMIN 4.5    Basename 03/15/12 0352  LIPASE >3000*  AMYLASE --   CBC:  Basename 03/15/12 0352 03/15/12 0350  WBC 10.4 --  NEUTROABS 7.1 7.1  HGB 13.6 --  HCT 40.5 --  MCV 90.4 --  PLT 334 --    CBG:  Basename 03/15/12 0820  GLUCAP 129*   Hemoglobin A1C:  Basename 03/15/12 0617  HGBA1C 6.0*   Fasting Lipid Panel:  Basename 03/15/12 0617  CHOL 165  HDL 39*  LDLCALC 102*  TRIG 118  CHOLHDL 4.2  LDLDIRECT --   Urinalysis:  Basename 03/15/12 0426  COLORURINE YELLOW  LABSPEC 1.025  PHURINE 7.0  GLUCOSEU NEGATIVE  HGBUR NEGATIVE  BILIRUBINUR NEGATIVE  KETONESUR NEGATIVE  PROTEINUR NEGATIVE    UROBILINOGEN 1.0  NITRITE NEGATIVE  LEUKOCYTESUR NEGATIVE    Studies/Results: Ct Abdomen Pelvis Wo Contrast  IMPRESSION:  1.  Marked peripancreatic stranding compatible with acute pancreatitis. No discrete abscess identified, although the absence of IV contrast lowers sensitivity for abscess and pancreatic necrosis. 2.  Jejunojejunal intussusception, with postoperative findings along the intussuscipiens and dilation of the intussuscipiens. 3.  Sacral deformity appears chronic. 4.  Diffuse hepatic steatosis.  Medications: Scheduled Meds:   . sodium chloride   Intravenous Once  . fentaNYL  50 mcg Intravenous Once  .  HYDROmorphone (DILAUDID) injection  1 mg Intravenous Once  . insulin aspart  0-9 Units Subcutaneous TID WC  . ondansetron (ZOFRAN) IV  4 mg Intravenous Once  . pantoprazole (PROTONIX) IV  40 mg Intravenous Once  . DISCONTD:  HYDROmorphone (DILAUDID) injection  1 mg Intravenous Once   Continuous Infusions:   . sodium chloride     PRN Meds:.HYDROmorphone (DILAUDID) injection, ondansetron (ZOFRAN) IV, promethazine  Assessment/Plan: Patient Active Hospital Problem List: PANCREATITIS (07/06/2008)  markedly elevated lipase levels. No immediate cause obvious-not from gallbladder or alcohol. Had recent cholesterol test done and wife is getting triglyceride levels reported. My suspicion this may be secondary to medications. Patient has been on a recent cholesterol medicine and has been on Protonix for some time. Both of these have been known to  cause pancreatitis. Recommended discontinuation of both upon discharge. In the meantime, n.p.o. plus IV fluids plus pain and nausea medications. Repeat lipase level morning.  GERD (gastroesophageal reflux disease) (03/15/2012)  stable. See above.  DM2 (diabetes mellitus, type 2) (03/15/2012)  stable. He is on metformin. His A1c is actually And could discuss with the patient to discuss his primary care doctor about diet control  only.  Hyperlipidemia (03/15/2012)  note recent cholesterol medication is not listed in his home meds. See above.   LOS: 0 days   Hollice Espy 03/15/2012, 7:16 PM

## 2012-03-15 NOTE — ED Notes (Signed)
Patient c/o progressive worsening epigastric pain x 2-3 days. Reports it started to get really bad at approx. 1600 yesterday accompanied with nausea. Denies emesis. Hx of pancreatitis.

## 2012-03-15 NOTE — ED Notes (Addendum)
cbg 119 at 1428

## 2012-03-15 NOTE — ED Notes (Signed)
Patient states pain is better, no nausea, report called to [facility] [employer]

## 2012-03-15 NOTE — ED Notes (Signed)
Patient complaining of nausea

## 2012-03-15 NOTE — ED Provider Notes (Signed)
History     CSN: [id number]  Arrival date & time 03/15/12  0031   First MD Initiated Contact with Patient 03/15/12 0410      Chief Complaint  Patient presents with  . Abdominal Pain    (Consider location/radiation/quality/duration/timing/severity/associated sxs/prior treatment) Patient is a 41 y.o. male presenting with abdominal pain. The history is provided by the patient. No language interpreter was used.  Abdominal Pain The primary symptoms of the illness include abdominal pain. The primary symptoms of the illness do not include vomiting. The current episode started yesterday. The onset of the illness was sudden. The problem has not changed since onset. The abdominal pain is located in the periumbilical region. The abdominal pain does not radiate. The severity of the abdominal pain is 10/10. The abdominal pain is relieved by nothing. Exacerbated by: nothing.  Associated with: h/o pancreatitis. The patient has not had a change in bowel habit. Risk factors for an acute abdominal problem include a history of abdominal surgery. Additional symptoms associated with the illness include back pain. Symptoms associated with the illness do not include frequency. Associated medical issues comments: pancreatitis.    Past Medical History  Diagnosis Date  . Diabetes mellitus   . Pancreatitis   . Pancreatic cystadenoma   . Hyperlipidemia   . GERD (gastroesophageal reflux disease)     Past Surgical History  Procedure Date  . Pancreatitis surgery      circa 2003 in [place], [place]  . Hernia repair   . Cholecystectomy     circa 2003 in [place], [place]    Family History  Problem Relation Age of Onset  . Arthritis Sister     History  Substance Use Topics  . Smoking status: Former Smoker    Types: Cigars    Quit date: 12/29/2011  . Smokeless tobacco: Not on file  . Alcohol Use: No      Review of Systems  Constitutional: Negative.   HENT: Negative.   Eyes: Negative.     Respiratory: Negative.   Cardiovascular: Negative.   Gastrointestinal: Positive for abdominal pain. Negative for vomiting.  Genitourinary: Negative for frequency.  Musculoskeletal: Positive for back pain.  Neurological: Negative.   Hematological: Negative.   Psychiatric/Behavioral: Negative.     Allergies  Iohexol  Home Medications   Current Outpatient Rx  Name Route Sig Dispense Refill  . METFORMIN HCL 500 MG PO TABS Oral Take 500 mg by mouth 2 (two) times daily with a meal.    . PANTOPRAZOLE SODIUM 40 MG PO TBEC Oral Take 40 mg by mouth daily.      BP 126/79  Pulse 75  Temp(Src) 98 F (36.7 C) (Oral)  Resp 18  SpO2 97%  Physical Exam  Constitutional: He is oriented to person, place, and time. He appears well-developed and well-nourished.  HENT:  Head: Normocephalic and atraumatic.  Mouth/Throat: Oropharynx is clear and moist.  Eyes: Conjunctivae are normal. Pupils are equal, round, and reactive to light.  Neck: Normal range of motion. Neck supple.  Cardiovascular: Normal rate and regular rhythm.   Pulmonary/Chest: Effort normal and breath sounds normal. He has no wheezes.  Abdominal: Soft. Bowel sounds are normal. There is tenderness in the periumbilical area.  Musculoskeletal: Normal range of motion.  Neurological: He is alert and oriented to person, place, and time.  Skin: Skin is warm and dry.  Psychiatric: He has a normal mood and affect.    ED Course  Procedures (including critical care time)  Labs  Reviewed  CBC - Abnormal; Notable for the following:    RDW 11.3 (*)    All other components within normal limits  COMPREHENSIVE METABOLIC PANEL - Abnormal; Notable for the following:    Sodium 133 (*)    Glucose, Bld 158 (*)    AST 38 (*)    ALT 60 (*)    All other components within normal limits  LIPASE, BLOOD - Abnormal; Notable for the following:    Lipase >3000 (*)    All other components within normal limits  DIFFERENTIAL  URINALYSIS, ROUTINE W  REFLEX MICROSCOPIC  LACTATE DEHYDROGENASE  LIPID PANEL  HEMOGLOBIN A1C   No results found.   1. Pancreatitis       MDM  Plan admit for pancreatitis      Avian Greenawalt K Kaylyn Garrow-Rasch, MD 03/15/12 [phone]

## 2012-03-15 NOTE — H&P (Signed)
Cole Craig is an 41 y.o. male.    Cc: Dr. Juluis Rainier (pcp), Dr. Arlyce Dice (GI)  Chief Complaint: abdominal pain HPI: 41 yo male with hx of pancreatitis, s/p surgery for necrotizing pancreatitis, ccy, has complaints of abdominal pain x 3-4 days, worse last nite,  Epigastric, and very severe with radiation to the back, associated with some nausea.  Pt stated felt similar to prior pancreatitis pain.  Denies fever, chills.  In ED lipase >3000.  CT scan abdomen, pelvis pending,  Pt will be admitted for pancreatitis. Note that pt drank some protein shake that he thinks contributed to his pancreatitis attack, (prev, ?mussel milk gave him pancreatitis in the past)    Past Medical History  Diagnosis Date  . Diabetes mellitus   . Pancreatitis   . Pancreatic cystadenoma   . Hyperlipidemia   . GERD (gastroesophageal reflux disease)     Past Surgical History  Procedure Date  . Pancreatitis surgery      circa 2003 in [place], [place]  . Hernia repair   . Cholecystectomy     circa 2003 in [place], [place]    Family History  Problem Relation Age of Onset  . Arthritis Sister    Social History:  reports that he quit smoking about 2 months ago. His smoking use included Cigars. He does not have any smokeless tobacco history on file. He reports that he does not drink alcohol or use illicit drugs.  Allergies:  Allergies  Allergen Reactions  . Iohexol      Code: RASH, Desc: RASH AND TONGUE SWELLING     Medications Prior to Admission  Medication Dose Route Frequency Provider Last Rate Last Dose  . 0.9 %  sodium chloride infusion   Intravenous Once April K Palumbo-Rasch, MD 999 mL/hr at 03/15/12 0359    . fentaNYL (SUBLIMAZE) injection 50 mcg  50 mcg Intravenous Once April K Palumbo-Rasch, MD   50 mcg at 03/15/12 0349  . HYDROmorphone (DILAUDID) injection 1 mg  1 mg Intravenous Once April K Palumbo-Rasch, MD   1 mg at 03/15/12 0432  . HYDROmorphone (DILAUDID) injection 1 mg  1 mg  Intravenous Once April K Palumbo-Rasch, MD      . ondansetron [employer]) injection 4 mg  4 mg Intravenous Once April K Palumbo-Rasch, MD   4 mg at 03/15/12 0349   No current outpatient prescriptions on file as of 03/15/2012.    Results for orders placed during the hospital encounter of 03/15/12 (from the past 48 hour(s))  CBC     Status: Abnormal   Collection Time   03/15/12  3:52 AM      Component Value Range Comment   WBC 10.4  4.0 - 10.5 (K/uL)    RBC 4.48  4.22 - 5.81 (MIL/uL)    Hemoglobin 13.6  13.0 - 17.0 (g/dL)    HCT 16.1  09.6 - 04.5 (%)    MCV 90.4  78.0 - 100.0 (fL)    MCH 30.4  26.0 - 34.0 (pg)    MCHC 33.6  30.0 - 36.0 (g/dL)    RDW 40.9 (*) 81.1 - 15.5 (%)    Platelets 334  150 - 400 (K/uL)   DIFFERENTIAL     Status: Normal   Collection Time   03/15/12  3:52 AM      Component Value Range Comment   Neutrophils Relative 68  43 - 77 (%)    Neutro Abs 7.1  1.7 - 7.7 (K/uL)  Lymphocytes Relative 26  12 - 46 (%)    Lymphs Abs 2.7  0.7 - 4.0 (K/uL)    Monocytes Relative 5  3 - 12 (%)    Monocytes Absolute 0.6  0.1 - 1.0 (K/uL)    Eosinophils Relative 1  0 - 5 (%)    Eosinophils Absolute 0.1  0.0 - 0.7 (K/uL)    Basophils Relative 0  0 - 1 (%)    Basophils Absolute 0.0  0.0 - 0.1 (K/uL)   COMPREHENSIVE METABOLIC PANEL     Status: Abnormal   Collection Time   03/15/12  3:52 AM      Component Value Range Comment   Sodium 133 (*) 135 - 145 (mEq/L)    Potassium 3.9  3.5 - 5.1 (mEq/L)    Chloride 96  96 - 112 (mEq/L)    CO2 28  19 - 32 (mEq/L)    Glucose, Bld 158 (*) 70 - 99 (mg/dL)    BUN 16  6 - 23 (mg/dL)    Creatinine, Ser 1.61  0.50 - 1.35 (mg/dL)    Calcium 9.6  8.4 - 10.5 (mg/dL)    Total Protein 7.9  6.0 - 8.3 (g/dL)    Albumin 4.5  3.5 - 5.2 (g/dL)    AST 38 (*) 0 - 37 (U/L)    ALT 60 (*) 0 - 53 (U/L)    Alkaline Phosphatase 99  39 - 117 (U/L)    Total Bilirubin 0.6  0.3 - 1.2 (mg/dL)    GFR calc non Af Amer >90  >90 (mL/min)    GFR calc Af Amer >90  >90  (mL/min)   LIPASE, BLOOD     Status: Abnormal   Collection Time   03/15/12  3:52 AM      Component Value Range Comment   Lipase >3000 (*) 11 - 59 (U/L)   URINALYSIS, ROUTINE W REFLEX MICROSCOPIC     Status: Normal   Collection Time   03/15/12  4:26 AM      Component Value Range Comment   Color, Urine YELLOW  YELLOW     APPearance CLEAR  CLEAR     Specific Gravity, Urine 1.025  1.005 - 1.030     pH 7.0  5.0 - 8.0     Glucose, UA NEGATIVE  NEGATIVE (mg/dL)    Hgb urine dipstick NEGATIVE  NEGATIVE     Bilirubin Urine NEGATIVE  NEGATIVE     Ketones, ur NEGATIVE  NEGATIVE (mg/dL)    Protein, ur NEGATIVE  NEGATIVE (mg/dL)    Urobilinogen, UA 1.0  0.0 - 1.0 (mg/dL)    Nitrite NEGATIVE  NEGATIVE     Leukocytes, UA NEGATIVE  NEGATIVE  MICROSCOPIC NOT DONE ON URINES WITH NEGATIVE PROTEIN, BLOOD, LEUKOCYTES, NITRITE, OR GLUCOSE <1000 mg/dL.   No results found.  Review of Systems  Constitutional: Negative for fever and chills.  HENT: Negative for hearing loss.   Respiratory: Negative for cough, hemoptysis, sputum production and shortness of breath.   Cardiovascular: Negative for chest pain, palpitations, orthopnea and leg swelling.  Gastrointestinal: Positive for nausea and abdominal pain. Negative for heartburn, vomiting, diarrhea, blood in stool and melena.  Genitourinary: Negative for dysuria and urgency.  Musculoskeletal: Negative for myalgias and falls.  Skin: Negative for itching and rash.  Neurological: Negative for dizziness, tingling, tremors and headaches.  Psychiatric/Behavioral: Negative for depression and suicidal ideas.    Blood pressure 109/77, pulse 60, temperature 98.1 F (36.7 C), temperature source Oral, resp.  rate 18, SpO2 97.00%. Physical Exam  Constitutional: He is oriented to person, place, and time. He appears well-developed and well-nourished.  HENT:  Head: Normocephalic and atraumatic.  Eyes: Conjunctivae and EOM are normal. Pupils are equal, round, and  reactive to light. Right eye exhibits no discharge. Left eye exhibits no discharge.  Neck: Normal range of motion. Neck supple. No JVD present. No tracheal deviation present. No thyromegaly present.  Cardiovascular: Normal rate, regular rhythm and normal heart sounds.  Exam reveals no gallop and no friction rub.   No murmur heard. Respiratory: Effort normal and breath sounds normal. No respiratory distress. He has no wheezes. He has no rales. He exhibits no tenderness.  GI: Soft. Bowel sounds are normal. He exhibits no distension. There is no tenderness. There is no rebound and no guarding.  Musculoskeletal: He exhibits no edema and no tenderness.  Lymphadenopathy:    He has no cervical adenopathy.  Neurological: He is alert and oriented to person, place, and time. He displays normal reflexes. No cranial nerve deficit. He exhibits normal muscle tone. Coordination normal.  Skin: Skin is warm and dry. No rash noted. No erythema.  Psychiatric: He has a normal mood and affect. His behavior is normal.  negative cullen sign, negative for palmar erythema   Assessment/Plan Pancreatitis ? Secondary to protein shake ingestion vs idiopathic:   Hydrate with NS iv, pain control with dilaudid, phenergan and zofran, NPO Abnormal lft:  Check CT scan of the Abd/pelvis, check acute hepatitis panel Dm2: hold off on metformin.  fsbs q4h ISS Gerd: continue protonix  Pearson Grippe 03/15/2012, 6:00 AM

## 2012-03-15 NOTE — ED Notes (Signed)
Patient given 1mg  Dilaudid IV for headache, patient vomited 300cc bile

## 2012-03-15 NOTE — ED Notes (Signed)
cbg 95 

## 2012-03-16 LAB — COMPREHENSIVE METABOLIC PANEL
AST: 21 U/L (ref 0–37)
CO2: 25 mEq/L (ref 19–32)
Chloride: 103 mEq/L (ref 96–112)
Creatinine, Ser: 0.73 mg/dL (ref 0.50–1.35)
GFR calc Af Amer: >90 mL/min (ref >90)
GFR calc non Af Amer: >90 mL/min (ref >90)
Glucose, Bld: 108 mg/dL — ABNORMAL HIGH (ref 70–99)
Total Bilirubin: 0.5 mg/dL (ref 0.3–1.2)

## 2012-03-16 LAB — CBC
MCH: 30.4 pg (ref 26.0–34.0)
MCHC: 33.4 g/dL (ref 30.0–36.0)
Platelets: 276 10*3/uL (ref 150–400)
RBC: 3.88 MIL/uL — ABNORMAL LOW (ref 4.22–5.81)

## 2012-03-16 LAB — GLUCOSE, CAPILLARY
Glucose-Capillary: 125 mg/dL — ABNORMAL HIGH (ref 70–99)
Glucose-Capillary: 112 mg/dL — ABNORMAL HIGH (ref 70–99)
Glucose-Capillary: 79 mg/dL (ref 70–99)

## 2012-03-16 LAB — LIPASE, BLOOD: Lipase: 682 U/L — ABNORMAL HIGH (ref 11–59)

## 2012-03-16 MED ORDER — KCL IN DEXTROSE-NACL 20-5-0.45 MEQ/L-%-% IV SOLN
INTRAVENOUS | Status: DC
  Administered 2012-03-16: 150 mL/h via INTRAVENOUS
  Administered 2012-03-17 – 2012-03-18 (×4): via INTRAVENOUS
  Filled 2012-03-16 (×10): qty 1000

## 2012-03-16 MED ORDER — POTASSIUM CHLORIDE IN NACL 20-0.9 MEQ/L-% IV SOLN
INTRAVENOUS | Status: DC
  Administered 2012-03-16: 180 mL/h via INTRAVENOUS
  Administered 2012-03-16: 10:00:00 via INTRAVENOUS
  Filled 2012-03-16 (×5): qty 1000

## 2012-03-16 MED ORDER — ACETAMINOPHEN 650 MG RE SUPP: 650.0000 mg | RECTAL | Status: DC | PRN

## 2012-03-16 MED ORDER — HYDROMORPHONE HCL PF 1 MG/ML IJ SOLN
1.0000 mg | INTRAMUSCULAR | Status: DC | PRN
  Administered 2012-03-16 – 2012-03-17 (×4): 1 mg via INTRAVENOUS
  Filled 2012-03-16 (×4): qty 1

## 2012-03-16 MED ORDER — IBUPROFEN 100 MG/5ML PO SUSP
400.0000 mg | Freq: Four times a day (QID) | ORAL | Status: AC | PRN
  Administered 2012-03-16 (×2): 400 mg via ORAL
  Filled 2012-03-16 (×4): qty 20

## 2012-03-16 MED ORDER — ACETAMINOPHEN 325 MG PO TABS: 650.0000 mg | ORAL_TABLET | Freq: Four times a day (QID) | ORAL | Status: DC | PRN

## 2012-03-16 NOTE — Progress Notes (Signed)
[facility] Inpatient Progress Note  03/16/2012  Subjective: Pt having significant headache pain.  Pt says that he is nauseated but abdominal pain is slowly improving.  Pt had recently been consuming protein shakes that he feels may have caused the pancreatitis.   Objective:  Vital signs in last 24 hours: Filed Vitals:   03/15/12 1448 03/15/12 1753 03/15/12 2110 03/16/12 0545  BP: 102/60 113/77 140/92 128/79  Pulse: 65 74 70 80  Temp: 98.5 F (36.9 C) 98.7 F (37.1 C) 98.1 F (36.7 C) 98.8 F (37.1 C)  TempSrc: Oral Oral Oral Oral  Resp: 16 16 18 18   Height:   5\' 7"  (1.702 m)   Weight:   64.2 kg (141 lb 8.6 oz) 63.6 kg (140 lb 3.4 oz)  SpO2: 97% 99% 97% 96%   Weight change:   Intake/Output Summary (Last 24 hours) at 03/16/12 1610 Last data filed at 03/16/12 9604  Gross per 24 hour  Intake      0 ml  Output   3175 ml  Net  -3175 ml   Lab Results  Component Value Date   HGBA1C 6.0* 03/15/2012   Lab Results  Component Value Date   LDLCALC 102* 03/15/2012   CREATININE 0.73 03/16/2012    Review of Systems As above, otherwise all reviewed and reported negative  Physical Exam General: Alert and oriented x3, no acute distress, cooperative HEENT: Normocephalic and atraumatic, MMM Cardiovascular: Regular rate and rhythm, S1-S2  Lungs: BBS clear Abdomen: significant epigastric TTP with guarding, BS present Extremities: No clubbing cyanosis or edema    Lab Results: Results for orders placed during the hospital encounter of 03/15/12 (from the past 24 hour(s))  COMPREHENSIVE METABOLIC PANEL     Status: Abnormal   Collection Time   03/16/12  4:25 AM      Component Value Range   Sodium 137  135 - 145 (mEq/L)   Potassium 3.5  3.5 - 5.1 (mEq/L)   Chloride 103  96 - 112 (mEq/L)   CO2 25  19 - 32 (mEq/L)   Glucose, Bld 108 (*) 70 - 99 (mg/dL)   BUN 6  6 - 23 (mg/dL)   Creatinine, Ser 5.40  0.50 - 1.35 (mg/dL)   Calcium 8.6  8.4 - 98.1 (mg/dL)   Total Protein 6.5   6.0 - 8.3 (g/dL)   Albumin 3.6  3.5 - 5.2 (g/dL)   AST 21  0 - 37 (U/L)   ALT 36  0 - 53 (U/L)   Alkaline Phosphatase 87  39 - 117 (U/L)   Total Bilirubin 0.5  0.3 - 1.2 (mg/dL)   GFR calc non Af Amer >90  >90 (mL/min)   GFR calc Af Amer >90  >90 (mL/min)  CBC     Status: Abnormal   Collection Time   03/16/12  4:25 AM      Component Value Range   WBC 9.7  4.0 - 10.5 (K/uL)   RBC 3.88 (*) 4.22 - 5.81 (MIL/uL)   Hemoglobin 11.8 (*) 13.0 - 17.0 (g/dL)   HCT 19.1 (*) 47.8 - 52.0 (%)   MCV 91.0  78.0 - 100.0 (fL)   MCH 30.4  26.0 - 34.0 (pg)   MCHC 33.4  30.0 - 36.0 (g/dL)   RDW 29.5  62.1 - 30.8 (%)   Platelets 276  150 - 400 (K/uL)  LIPASE, BLOOD     Status: Abnormal   Collection Time   03/16/12  4:25 AM  Component Value Range   Lipase 682 (*) 11 - 59 (U/L)    Micro Results: No results found for this or any previous visit (from the past 240 hour(s)).  Medications:  Scheduled Meds:   . insulin aspart  0-9 Units Subcutaneous TID WC  . pantoprazole (PROTONIX) IV  40 mg Intravenous Once   Continuous Infusions:   . 0.9 % NaCl with KCl 20 mEq / L    . DISCONTD: sodium chloride 200 mL/hr at 03/16/12 0831   PRN Meds:.HYDROmorphone (DILAUDID) injection, ondansetron (ZOFRAN) IV, promethazine  Assessment/Plan: PANCREATITIS, Acute - recurrent with history of necrotizing pancreatitis in the past markedly elevated lipase levels are improving, lipase down to 700. No immediate cause obvious-not from gallbladder or alcohol. Had recent cholesterol test done and was reviewed by me and levels were not high. This may be secondary to medications. Patient has been on a recent cholesterol medicine and has been on Protonix for some time. Both of these have been known to cause pancreatitis. Recommended discontinuation of both upon discharge. In the meantime, n.p.o. plus IV fluids plus pain and nausea medications. Repeat lipase level morning.   Headache - see orders  GERD (gastroesophageal  reflux disease) (03/15/2012) Severe, stable. See above.   DM2 (diabetes mellitus, type 2) (03/15/2012) Continue supplemental insulin as needed.   Hyperlipidemia (03/15/2012) Pt has been on lovastatin as outpatient.  Currently holding.  Lipids well controlled, not thought to be contributing to pancreatitis. See above.  Hepatic Steatosis   LOS: 1 day   Talbot Monarch 03/16/2012, 9:37 AM   Cleora Fleet, MD, CDE, FAAFP [facility] [place], [place]  [phone]

## 2012-03-17 LAB — COMPREHENSIVE METABOLIC PANEL
ALT: 32 U/L (ref 0–53)
AST: 21 U/L (ref 0–37)
Calcium: 9.3 mg/dL (ref 8.4–10.5)
Sodium: 136 mEq/L (ref 135–145)
Total Protein: 7 g/dL (ref 6.0–8.3)

## 2012-03-17 LAB — MAGNESIUM: Magnesium: 2 mg/dL (ref 1.5–2.5)

## 2012-03-17 LAB — CBC
HCT: 37.2 % — ABNORMAL LOW (ref 39.0–52.0)
Hemoglobin: 12.4 g/dL — ABNORMAL LOW (ref 13.0–17.0)
MCH: 30.6 pg (ref 26.0–34.0)
MCHC: 33.3 g/dL (ref 30.0–36.0)

## 2012-03-17 LAB — GLUCOSE, CAPILLARY: Glucose-Capillary: 144 mg/dL — ABNORMAL HIGH (ref 70–99)

## 2012-03-17 MED ORDER — ZOLPIDEM TARTRATE 10 MG PO TABS
10.0000 mg | ORAL_TABLET | Freq: Every evening | ORAL | Status: DC | PRN
  Administered 2012-03-17 – 2012-03-18 (×3): 10 mg via ORAL
  Filled 2012-03-17 (×3): qty 1

## 2012-03-17 MED ORDER — IBUPROFEN 100 MG/5ML PO SUSP
600.0000 mg | Freq: Four times a day (QID) | ORAL | Status: DC | PRN
  Filled 2012-03-17: qty 30

## 2012-03-17 MED ORDER — PANTOPRAZOLE SODIUM 20 MG PO TBEC
20.0000 mg | DELAYED_RELEASE_TABLET | Freq: Every day | ORAL | Status: DC | PRN
  Administered 2012-03-17 – 2012-03-19 (×3): 20 mg via ORAL
  Filled 2012-03-17 (×3): qty 1

## 2012-03-17 MED ORDER — DOCUSATE SODIUM 100 MG PO CAPS
100.0000 mg | ORAL_CAPSULE | Freq: Two times a day (BID) | ORAL | Status: DC
  Administered 2012-03-17 – 2012-03-19 (×5): 100 mg via ORAL
  Filled 2012-03-17 (×6): qty 1

## 2012-03-17 NOTE — Progress Notes (Deleted)
Dr. Johnson, C. Notified re; positive MRSA swab 

## 2012-03-17 NOTE — Progress Notes (Signed)
CHART REVIEWED; B Avyon Herendeen RN, BSN, MHA 

## 2012-03-17 NOTE — Progress Notes (Addendum)
[facility] Inpatient Progress Note  03/17/2012  Subjective: Pt says he is feeling much better overall.  He is wanting to try clear liquids.   Objective:  Vital signs in last 24 hours: Filed Vitals:   03/16/12 0545 03/16/12 1320 03/16/12 2140 03/17/12 0551  BP: 128/79 124/77 119/83 117/77  Pulse: 80 75 64 80  Temp: 98.8 F (37.1 C) 98.6 F (37 C) 98.5 F (36.9 C) 99.5 F (37.5 C)  TempSrc: Oral Oral Oral Oral  Resp: 18 18 18 16   Height:      Weight: 63.6 kg (140 lb 3.4 oz)   64 kg (141 lb 1.5 oz)  SpO2: 96% 96% 97% 98%   Weight change: -0.2 kg (-7.1 oz)  Intake/Output Summary (Last 24 hours) at 03/17/12 1129 Last data filed at 03/17/12 4010  Gross per 24 hour  Intake   1902 ml  Output   2750 ml  Net   -848 ml   Lab Results  Component Value Date   HGBA1C 6.0* 03/15/2012   Lab Results  Component Value Date   LDLCALC 102* 03/15/2012   CREATININE 0.74 03/17/2012    Review of Systems As above, otherwise all reviewed and reported negative  Physical Exam General: Alert and oriented x3, no acute distress, cooperative  HEENT: Normocephalic and atraumatic, MMM  Cardiovascular: Regular rate and rhythm, S1-S2  Lungs: BBS clear  Abdomen: soft, nontender, nondistended, no masses, BS present  Extremities: No clubbing cyanosis or edema    Lab Results: Results for orders placed during the hospital encounter of 03/15/12 (from the past 24 hour(s))  GLUCOSE, CAPILLARY     Status: Abnormal   Collection Time   03/16/12 11:46 AM      Component Value Range   Glucose-Capillary 108 (*) 70 - 99 (mg/dL)  GLUCOSE, CAPILLARY     Status: Normal   Collection Time   03/16/12  3:48 PM      Component Value Range   Glucose-Capillary 79  70 - 99 (mg/dL)  COMPREHENSIVE METABOLIC PANEL     Status: Abnormal   Collection Time   03/17/12  4:00 AM      Component Value Range   Sodium 136  135 - 145 (mEq/L)   Potassium 4.3  3.5 - 5.1 (mEq/L)   Chloride 102  96 - 112 (mEq/L)   CO2 28  19  - 32 (mEq/L)   Glucose, Bld 153 (*) 70 - 99 (mg/dL)   BUN 5 (*) 6 - 23 (mg/dL)   Creatinine, Ser 2.72  0.50 - 1.35 (mg/dL)   Calcium 9.3  8.4 - 53.6 (mg/dL)   Total Protein 7.0  6.0 - 8.3 (g/dL)   Albumin 3.6  3.5 - 5.2 (g/dL)   AST 21  0 - 37 (U/L)   ALT 32  0 - 53 (U/L)   Alkaline Phosphatase 92  39 - 117 (U/L)   Total Bilirubin 0.6  0.3 - 1.2 (mg/dL)   GFR calc non Af Amer >90  >90 (mL/min)   GFR calc Af Amer >90  >90 (mL/min)  CBC     Status: Abnormal   Collection Time   03/17/12  4:00 AM      Component Value Range   WBC 7.4  4.0 - 10.5 (K/uL)   RBC 4.05 (*) 4.22 - 5.81 (MIL/uL)   Hemoglobin 12.4 (*) 13.0 - 17.0 (g/dL)   HCT 64.4 (*) 03.4 - 52.0 (%)   MCV 91.9  78.0 - 100.0 (fL)   MCH 30.6  26.0 - 34.0 (pg)   MCHC 33.3  30.0 - 36.0 (g/dL)   RDW 16.1 (*) 09.6 - 15.5 (%)   Platelets 329  150 - 400 (K/uL)  LIPASE, BLOOD     Status: Abnormal   Collection Time   03/17/12  4:00 AM      Component Value Range   Lipase 96 (*) 11 - 59 (U/L)  MAGNESIUM     Status: Normal   Collection Time   03/17/12  4:00 AM      Component Value Range   Magnesium 2.0  1.5 - 2.5 (mg/dL)    Micro Results: No results found for this or any previous visit (from the past 240 hour(s)).  Medications:  Scheduled Meds:   . docusate sodium  100 mg Oral BID  . DISCONTD: insulin aspart  0-9 Units Subcutaneous TID WC   Continuous Infusions:   . dextrose 5 % and 0.45 % NaCl with KCl 20 mEq/L 150 mL/hr at 03/17/12 0300  . DISCONTD: 0.9 % NaCl with KCl 20 mEq / L 180 mL/hr (03/16/12 1607)   PRN Meds:.acetaminophen, acetaminophen, HYDROmorphone (DILAUDID) injection, ibuprofen, ibuprofen, ondansetron (ZOFRAN) IV, promethazine, zolpidem  Assessment/Plan: PANCREATITIS, Acute - recurrent with history of necrotizing pancreatitis in the past markedly elevated lipase levels are improving, lipase down to 96. No immediate cause obvious-not from gallbladder or alcohol. Had recent cholesterol test done and was  reviewed by me and levels were not high. This may be secondary to medications. Patient has been on a recent cholesterol medicine and has been on Protonix for some time. Both of these have been known to cause pancreatitis. Recommended discontinuation of both upon discharge. In the meantime, n.p.o. plus IV fluids plus pain and nausea medications. Repeat lipase level morning.  Starting clears diet and advance as tolerated.   Headache - pt says he is very hungry.  see orders   GERD (gastroesophageal reflux disease) (03/15/2012) Pt adamant about restarting protonix.  I told him that it can potentially cause pancreatitis, pt says he still wants to try it because his heartburn gets so bad.   DM2 (diabetes mellitus, type 2) (03/15/2012) Currently stable as pt had been NPO.     Hyperlipidemia (03/15/2012) Pt has been on lovastatin as outpatient. Currently holding. Lipids well controlled, not thought to be contributing to pancreatitis. See above.   Hepatic Steatosis   LOS: 2 days   Marna Weniger 03/17/2012, 11:29 AM   Cleora Fleet, MD, CDE, FAAFP [facility] [place], [place]  [phone]

## 2012-03-18 LAB — GLUCOSE, CAPILLARY: Glucose-Capillary: 196 mg/dL — ABNORMAL HIGH (ref 70–99)

## 2012-03-18 LAB — BASIC METABOLIC PANEL
Chloride: 100 mEq/L (ref 96–112)
GFR calc Af Amer: >90 mL/min (ref >90)
GFR calc non Af Amer: >90 mL/min (ref >90)
Glucose, Bld: 163 mg/dL — ABNORMAL HIGH (ref 70–99)
Potassium: 3.8 mEq/L (ref 3.5–5.1)
Sodium: 134 mEq/L — ABNORMAL LOW (ref 135–145)

## 2012-03-18 MED ORDER — INSULIN ASPART 100 UNIT/ML ~~LOC~~ SOLN
0.0000 [IU] | Freq: Three times a day (TID) | SUBCUTANEOUS | Status: DC
Start: 2012-03-18 — End: 2012-03-19
  Administered 2012-03-18: 3 [IU] via SUBCUTANEOUS
  Administered 2012-03-18 – 2012-03-19 (×2): 2 [IU] via SUBCUTANEOUS

## 2012-03-18 MED ORDER — HYDROMORPHONE HCL PF 1 MG/ML IJ SOLN
0.5000 mg | INTRAMUSCULAR | Status: DC | PRN
  Administered 2012-03-18 (×2): 0.5 mg via INTRAVENOUS
  Filled 2012-03-18 (×2): qty 1

## 2012-03-18 NOTE — Progress Notes (Signed)
[facility] Inpatient Progress Note  03/18/2012  Subjective: Pt is reporting that his abdominal pain is getting better.  He is starting solid foods and keeping them down.  No nausea or vomiting.  He is having headaches but reports that he is getting better.   He is going to start seeing a new GI doctor in [place], Dr. Lynann Bologna [phone].    Objective:  Vital signs in last 24 hours: Filed Vitals:   03/17/12 0551 03/17/12 1243 03/17/12 2105 03/18/12 0600  BP: 117/77 114/74 139/92 118/75  Pulse: 80 87 92 74  Temp: 99.5 F (37.5 C) 98.7 F (37.1 C) 99.4 F (37.4 C) 98.1 F (36.7 C)  TempSrc: Oral Oral Oral Oral  Resp: 16 16 16 18   Height:      Weight: 64 kg (141 lb 1.5 oz)     SpO2: 98% 96% 95% 98%   Weight change:   Intake/Output Summary (Last 24 hours) at 03/18/12 1008 Last data filed at 03/18/12 8295  Gross per 24 hour  Intake 1544.58 ml  Output   2250 ml  Net -705.42 ml   Lab Results  Component Value Date   HGBA1C 6.0* 03/15/2012   Lab Results  Component Value Date   LDLCALC 102* 03/15/2012   CREATININE 0.76 03/18/2012    Review of Systems As above, otherwise all reviewed and reported negative  Physical Exam General - pt sitting up eating breakfast, no distress, calm cooperative Heent - MMM Neck - supple Lungs - BBS clear CV - normal s1, s2 sounds Abd - soft, normal BS, nondistended, very minimal epigastric TTP  Lab Results: Results for orders placed during the hospital encounter of 03/15/12 (from the past 24 hour(s))  BASIC METABOLIC PANEL     Status: Abnormal   Collection Time   03/18/12  3:55 AM      Component Value Range   Sodium 134 (*) 135 - 145 (mEq/L)   Potassium 3.8  3.5 - 5.1 (mEq/L)   Chloride 100  96 - 112 (mEq/L)   CO2 28  19 - 32 (mEq/L)   Glucose, Bld 163 (*) 70 - 99 (mg/dL)   BUN 7  6 - 23 (mg/dL)   Creatinine, Ser 6.21  0.50 - 1.35 (mg/dL)   Calcium 9.5  8.4 - 30.8 (mg/dL)   GFR calc non Af Amer >90  >90 (mL/min)   GFR  calc Af Amer >90  >90 (mL/min)  LIPASE, BLOOD     Status: Abnormal   Collection Time   03/18/12  3:55 AM      Component Value Range   Lipase 95 (*) 11 - 59 (U/L)  GLUCOSE, CAPILLARY     Status: Abnormal   Collection Time   03/18/12  8:03 AM      Component Value Range   Glucose-Capillary 196 (*) 70 - 99 (mg/dL)    Micro Results: No results found for this or any previous visit (from the past 240 hour(s)).  Medications:  Scheduled Meds:   . docusate sodium  100 mg Oral BID   Continuous Infusions:   . dextrose 5 % and 0.45 % NaCl with KCl 20 mEq/L 125 mL/hr at 03/18/12 0622   PRN Meds:.acetaminophen, acetaminophen, HYDROmorphone (DILAUDID) injection, ibuprofen, ondansetron (ZOFRAN) IV, pantoprazole, promethazine, zolpidem  Assessment/Plan: Acute Pancreatitis - resolving. Encouraged pt to continue solid foods. Continue IVF today and hopefully home tomorrow.  GERD - pt decided he wanted to go back to taking protonix.  I explained to him that  this med has been reported to cause pancreatitis.  Pt verbalized understanding.   DM 2 - Now that pt has been eating, will start supplemental insulin as needed.  Hyperlipidemia/Hepatic Steatosis - holding lovastatin for now.  Pt to follow up with his GI specialist before resuming meds.     LOS: 3 days   Miklos Bidinger 03/18/2012, 10:08 AM   Cleora Fleet, MD, CDE, FAAFP [facility] [place], [place]  [phone]

## 2012-03-19 DIAGNOSIS — K5900 Constipation, unspecified: Secondary | ICD-10-CM | POA: Diagnosis not present

## 2012-03-19 LAB — LIPASE, BLOOD: Lipase: 96 U/L — ABNORMAL HIGH (ref 11–59)

## 2012-03-19 MED ORDER — LACTULOSE 10 GM/15ML PO SOLN: 20.0000 g | Freq: Two times a day (BID) | ORAL | Status: DC | PRN

## 2012-03-19 MED ORDER — LACTULOSE 10 GM/15ML PO SOLN: 20.0000 g | Freq: Two times a day (BID) | ORAL | Status: AC | PRN

## 2012-03-19 MED ORDER — DSS 100 MG PO CAPS: 100.0000 mg | ORAL_CAPSULE | Freq: Two times a day (BID) | ORAL | Status: AC | PRN

## 2012-03-19 NOTE — Discharge Summary (Signed)
HOSPITAL DISCHARGE SUMMARY   @n   Cole Craig, 41 y.o., DOB 1971-04-21, MRN [id number]  Admission date: 03/15/2012 Discharge Date: 03/19/2012  Primary MD Gaye Alken, MD, MD  Admitting Physician Hollice Espy, MD  Admission Diagnosis  Pancreatitis [577.0] pancreatitis pain  Discharge Diagnoses:    Active Hospital Problems  Diagnoses Date Noted   . PANCREATITIS 07/06/2008   . Constipation 03/19/2012   . GERD (gastroesophageal reflux disease) 03/15/2012   . DM2 (diabetes mellitus, type 2) 03/15/2012   . Hyperlipidemia 03/15/2012   . NAFLD (nonalcoholic fatty liver disease) 78/29/5621             Past Medical History  Diagnosis Date  . Diabetes mellitus   . Pancreatitis   . Pancreatic cystadenoma   . Hyperlipidemia   . GERD (gastroesophageal reflux disease)     Past Surgical History  Procedure Date  . Pancreatitis surgery      circa 2003 in [place], [place]  . Hernia repair   . Cholecystectomy     circa 2003 in [place], [place]    Significant Tests:  See full reports for all details   CT ABDOMEN AND PELVIS WITHOUT CONTRAST  Technique: Multidetector CT imaging of the abdomen and pelvis was  performed following the standard protocol without intravenous  contrast.  Comparison: 08/15/2009  Findings: Dependent subsegmental atelectasis noted in both lower  lobes.  Diffuse hepatic steatosis noted. The spleen and adrenal glands  appear normal.  Severe peripancreatic inflammatory stranding is noted, likely  reflecting acute pancreatitis. No large pseudocyst or definite  abscess noted, although IV contrast was not administered.  Secondary inflammatory thickening of the descending duodenum is  observed. Prior cholecystectomy noted.  Some of the peripancreatic stranding extends into the perirenal  fascia on the right. The kidneys appear otherwise unremarkable  noncontrast appearance.  There is a 5.2 cm focus of jejunojejunal intussusception. The    intussuscipiens is dilated. There appear to be postoperative  findings along the margins of this dilated loop, correlate with  operative history.  Scattered small mesenteric and gastrohepatic ligament nodes are  probably reactive. The appendix appears normal.  Anterior abdominal hernia mesh noted. There is stranding along the  right inguinal region. A small left inguinal hernia contains  adipose tissue.  Urinary bladder appears normal.  Right sacral deformity noted, with a prominent right sacral Tarlov  cyst observed.  IMPRESSION:  1. Marked peripancreatic stranding compatible with acute  pancreatitis. No discrete abscess identified, although the absence  of IV contrast lowers sensitivity for abscess and pancreatic  necrosis.  2. Jejunojejunal intussusception, with postoperative findings  along the intussuscipiens and dilation of the intussuscipiens.  3. Sacral deformity appears chronic.  4. Diffuse hepatic steatosis.   Hospital Course See H&P, Labs, Consult and Test reports for all details. In brief, this patient was hx of pancreatitis, s/p surgery for necrotizing pancreatitis, ccy, had complaints of abdominal pain x 3-4 days, worse last night, Epigastric, and very severe with radiation to the back, associated with some nausea. Pt stated felt similar to prior pancreatitis pain. Denies fever, chills. In ED lipase >3000. CT scan abdomen, pelvis pending, Pt will be admitted for pancreatitis. Note that pt drank some protein shake that he thinks contributed to his pancreatitis attack, (prev, ?mussel milk gave him pancreatitis in the past).  Pt says that he had been recently starting to drink protein shakes and reports that he believes this may have precipitated the pancreatitis.  He also had been taking  some medications that have been reported to cause pancreatitis.  He takes protonix daily and insisted that he keep taking it. I explained to him that this may have precipitated his pancreatitis as  we have not found another cause and patient verbalized understanding.  Pt has type 2 DM and it has been well controlled.  He had been on metformin.  I asked him to work on diet control for his DM and don't start metformin until he has been seen in follow up by his PCP and GI doctor.  Pt said that he is going to establish care with Dr. Terrilee Croak in [place] for his GI care.  I asked him to be sure to follow up within a week.  Pt was found to have hepatic steatosis and will follow up with GI.  He was also on a statin for cholesterol and I asked him to wait til he follows up with his GI specialist for permission to restart it again.  Pt. Has a history of a severe prolonged bout of pancreatitis requiring multiple months of hospitalization and abdominal surgery.  Because of this we watched him closely and only slowly advanced his diet until he was able to fully tolerate a regular diet.  His lipase enzyme improved rapidly with IVFs and bowel rest.   His lipase improved to less than 100 and pt's symptoms completely resolved and he tolerated diet well.  It was felt he was safe to go home to have close outpatient follow up.    Active Hospital Problems  Diagnoses Date Noted   . PANCREATITIS 07/06/2008   . Constipation 03/19/2012   . GERD (gastroesophageal reflux disease) 03/15/2012   . DM2 (diabetes mellitus, type 2) 03/15/2012   . Hyperlipidemia 03/15/2012   . NAFLD (nonalcoholic fatty liver disease) 16/09/9603       Today's Assessment:   Subjective:   Cole Craig  Pt resting in bed, no distress, no complaints, denies n/v/d.  Says he has had some constipation.   Objective:   Blood pressure 114/73, pulse 72, temperature 97.4 F (36.3 C), temperature source Oral, resp. rate 16, height 5\' 7"  (1.702 m), weight 63.7 kg (140 lb 6.9 oz), SpO2 97.00%.  Intake/Output Summary (Last 24 hours) at 03/19/12 0923 Last data filed at 03/19/12 0040  Gross per 24 hour  Intake   1200 ml  Output    900 ml  Net     300 ml    Exam General - as above Heent - MMM Neck - supple Lungs - BBS clear CV - normal s1, s2 sounds Abd - soft, nontender, normal BS, no masses, midline scarring, no guarding or rebound TTP Ext - no CCE Neuro - no focal findings     Lab Results  Component Value Date   WBC 7.4 03/17/2012   HGB 12.4* 03/17/2012   HCT 37.2* 03/17/2012   PLT 329 03/17/2012   LYMPHOPCT 26 03/15/2012   MONOPCT 5 03/15/2012   EOSPCT 1 03/15/2012   BASOPCT 0 03/15/2012   CMP: Lab Results  Component Value Date   NA 134* 03/18/2012   K 3.8 03/18/2012   CL 100 03/18/2012   CO2 28 03/18/2012   BUN 7 03/18/2012   CREATININE 0.76 03/18/2012   PROT 7.0 03/17/2012   ALBUMIN 3.6 03/17/2012   BILITOT 0.6 03/17/2012   ALKPHOS 92 03/17/2012   AST 21 03/17/2012   ALT 32 03/17/2012  .  Discharge Instructions    Please continue soft, bland, low carb diet  once discharged until you see Dr. Chales Abrahams next week Return if symptoms recur or new problems develop Check BS at least once per day Have your lipase and metabolic panel rechecked with Dr. Chales Abrahams next week  DISCHARGE MEDICATION: Medication List  As of 03/19/2012  9:23 AM   ASK your doctor about these medications         metFORMIN 500 MG tablet   Commonly known as: GLUCOPHAGE   Take 500 mg by mouth 2 (two) times daily with a meal.      pantoprazole 40 MG tablet   Commonly known as: PROTONIX   Take 40 mg by mouth daily.           Disposition and Follow-up:  Follow-up Information    Follow up with [facility], MD in 1 week.   Contact information:   [address]  [place] [address] [phone]           The risks, benefits, and possible side effects of all treatments and tests were explained to the patient.  The patient verbalized understanding.  The importance of close follow up with the primary care medical provider was explained clearly to the patient.  The patient verbalized understanding.  The patient was given  instructions to return if symptoms recur, worsen or new changes develop.  The patient verbalized understanding.   Cleora Fleet, MD, CDE, FAAFP [facility] [place], [place]   Total Time spent reviewing critical document, reviewing this patient's comprehensive hospitalization, arranging follow up and coordination of care, reviewing data and todays exam greater than 35 minutes.   Signed: Abaigeal Moomaw Laural Benes 03/19/2012 9:23 AM

## 2012-03-19 NOTE — Discharge Instructions (Signed)
Acute Pancreatitis The pancreas is a large gland located behind your stomach. It produces (secretes) enzymes. These enzymes help digest food. It also releases the hormones glucagon and insulin. These hormones help regulate blood sugar. When the pancreas becomes inflamed, the disease is called pancreatitis. Inflammation of the pancreas occurs when enzymes from the pancreas begin attacking and digesting the pancreas. CAUSES  Most cases ofsudden onset (acute) pancreatitis are caused by:  Alcohol abuse.   Gallstones.  Other less common causes are:  Some medications.   Exposure to certain chemicals   Infection.   Damage caused by an accident (trauma).   Surgery of the belly (abdomen).  SYMPTOMS  Acute pancreatitis usually begins with pain in the upper abdomen and may radiate to the back. This pain may last a couple days. The constant pain varies from mild to severe. The acute form of this disease may vary from mild, nonspecific abdominal pain to profound shock with coma. About 1 in 5 cases are severe. These patients become dehydrated and develop low blood pressure. In severe cases, bleeding into the pancreas can lead to shock and death. The lungs, heart, and kidneys may fail. DIAGNOSIS  Your caregiver will form a clinical opinion after giving you an exam. Laboratory work is used to confirm this diagnosis. Often,a digestive enzyme from the pancreas (serum amylase) and other enzymes are elevated. Sugars and fats (lipids) in the blood may be elevated. There may also be changes in the following levels: calcium, magnesium, potassium, chloride and bicarbonate (chemicals in the blood). X-rays, a CT scan, or ultrasound of your abdomen may be necessary to search for other causes of your abdominal pain. TREATMENT  Most pancreatitis requires treatment of symptoms. Most acute attacks last a couple of days. Your caregiver can discuss the treatment options with you.  If complications occur, hospitalization  may be necessary for pain control and intravenous (IV) fluid replacement.   Sometimes, a tube may be put into the stomach to control vomiting.   Food may not be allowed for 3 to 4 days. This gives the pancreas time to rest. Giving the pancreas a rest means there is no stimulation that would produce more enzymes and cause more damage.   Medicines (antibiotics) that kill germs may be given if infection is the cause.   Sometimes, surgery may be required.   Following an acute attack, your caregiver will determine the cause, if possible, and offer suggestions to prevent recurrences.  HOME CARE INSTRUCTIONS   Eat smaller, more frequent meals. This reduces the amount of digestive juices the pancreas produces.   Decrease the amount of fat in your diet. This may help reduce loose, diarrheal stools.   Drink enough water and fluids to keep your urine clear or pale yellow. This is to avoid dehydration which can cause increased pain.   Talk to your caregiver about pain relievers or other medicines that may help.   Avoid anything that may have triggered your pancreatitis (for example, alcohol).   Follow the diet advised by your caregiver. Do not advance the diet too soon.   Take medicines as prescribed.   Get plenty of rest.   Check your blood sugar at home as directed by your caregiver.   If your caregiver has given you a follow-up appointment, it is very important to keep that appointment. Not keeping the appointment could result in a lasting (chronic) or permanent injury, pain, and disability. If there is any problem keeping the appointment, you must call to reschedule.    SEEK MEDICAL CARE IF:   You are not recovering in the time described by your caregiver.   You have persistent pain, weakness, or feel sick to your stomach (nauseous).   You have recovered and then have another bout of pain.  SEEK IMMEDIATE MEDICAL CARE IF:   You are unable to eat or keep fluids down.   Your pain  increases a lot or changes.   You have an oral temperature above 102 F (38.9 C), not controlled by medicine.   Your skin or the white part of your eyes look yellow (jaundice).   You develop vomiting.   You feel dizzy or faint.   Your blood sugar is high (over 300).  MAKE SURE YOU:   Understand these instructions.   Will watch your condition.   Will get help right away if you are not doing well or get worse.  Document Released: 12/14/2005 Document Revised: 12/03/2011 Document Reviewed: 07/27/2008 [facility] Patient Information 2012 [place], [place].  Constipation in Adults Constipation is having fewer than 2 bowel movements per week. Usually, the stools are hard. As we grow older, constipation is more common. If you try to fix constipation with laxatives, the problem may get worse. This is because laxatives taken over a long period of time make the colon muscles weaker. A low-fiber diet, not taking in enough fluids, and taking some medicines may make these problems worse. MEDICATIONS THAT MAY CAUSE CONSTIPATION  Water pills (diuretics).   Calcium channel blockers (used to control blood pressure and for the heart).   Certain pain medicines (narcotics).   Anticholinergics.   Anti-inflammatory agents.   Antacids that contain aluminum.  DISEASES THAT CONTRIBUTE TO CONSTIPATION  Diabetes.   Parkinson's disease.   Dementia.   Stroke.   Depression.   Illnesses that cause problems with salt and water metabolism.  HOME CARE INSTRUCTIONS   Constipation is usually best cared for without medicines. Increasing dietary fiber and eating more fruits and vegetables is the best way to manage constipation.   Slowly increase fiber intake to 25 to 38 grams per day. Whole grains, fruits, vegetables, and legumes are good sources of fiber. A dietitian can further help you incorporate high-fiber foods into your diet.   Drink enough water and fluids to keep your urine clear or pale yellow.    A fiber supplement may be added to your diet if you cannot get enough fiber from foods.   Increasing your activities also helps improve regularity.   Suppositories, as suggested by your caregiver, will also help. If you are using antacids, such as aluminum or calcium containing products, it will be helpful to switch to products containing magnesium if your caregiver says it is okay.   If you have been given a liquid injection (enema) today, this is only a temporary measure. It should not be relied on for treatment of longstanding (chronic) constipation.   Stronger measures, such as magnesium sulfate, should be avoided if possible. This may cause uncontrollable diarrhea. Using magnesium sulfate may not allow you time to make it to the bathroom.  SEEK IMMEDIATE MEDICAL CARE IF:   There is bright red blood in the stool.   The constipation stays for more than 4 days.   There is belly (abdominal) or rectal pain.   You do not seem to be getting better.   You have any questions or concerns.  MAKE SURE YOU:   Understand these instructions.   Will watch your condition.   Will get help  right away if you are not doing well or get worse.  Document Released: 09/11/2004 Document Revised: 12/03/2011 Document Reviewed: 11/17/2011 [facility] Patient Information 2012 [place], [place].  Diabetes, Type 2 Diabetes is a long-lasting (chronic) disease. In type 2 diabetes, the pancreas does not make enough insulin (a hormone), and the body does not respond normally to the insulin that is made. This type of diabetes was also previously called adult-onset diabetes. It usually occurs after the age of 61, but it can occur at any age.  CAUSES  Type 2 diabetes happens because the pancreasis not making enough insulin or your body has trouble using the insulin that your pancreas does make properly. SYMPTOMS  Drinking more than usual.  Urinating more than usual.  Blurred vision.  Dry, itchy skin.  Frequent  infections.  Feeling more tired than usual (fatigue).  DIAGNOSIS The diagnosis of type 2 diabetes is usually made by one of the following tests: Fasting blood glucose test. You will not eat for at least 8 hours and then take a blood test.  Random blood glucose test. Your blood glucose (sugar) is checked at any time of the day regardless of when you ate.  Oral glucose tolerance test (OGTT). Your blood glucose is measured after you have not eaten (fasted) and then after you drink a glucose containing beverage.  TREATMENT  Healthy eating.  Exercise.  Medicine, if needed.  Monitoring blood glucose.  Seeing your caregiver regularly.  HOME CARE INSTRUCTIONS  Check your blood glucose at least once a day. More frequent monitoring may be necessary, depending on your medicines and on how well your diabetes is controlled. Your caregiver will advise you.  Take your medicine as directed by your caregiver.  Do not smoke.  Make wise food choices. Ask your caregiver for information. Weight loss can improve your diabetes.  Learn about low blood glucose (hypoglycemia) and how to treat it.  Get your eyes checked regularly.  Have a yearly physical exam. Have your blood pressure checked and your blood and urine tested.  Wear a pendant or bracelet saying that you have diabetes.  Check your feet every night for cuts, sores, blisters, and redness. Let your caregiver know if you have any problems.  SEEK MEDICAL CARE IF:  You have problems keeping your blood glucose in target range.  You have problems with your medicines.  You have symptoms of an illness that do not improve after 24 hours.  You have a sore or wound that is not healing.  You notice a change in vision or a new problem with your vision.  You have a fever.  MAKE SURE YOU: Understand these instructions.  Will watch your condition.  Will get help right away if you are not doing well or get worse.  Document Released: 12/14/2005 Document Revised:  12/03/2011 Document Reviewed: 06/01/2011 [facility] Patient Information 2012 [place], [place].

## 2013-07-06 ENCOUNTER — Telehealth: Payer: Self-pay | Admitting: Endocrinology

## 2013-07-06 MED ORDER — PREGABALIN 75 MG PO CAPS: 75.0000 mg | ORAL_CAPSULE | Freq: Two times a day (BID) | ORAL | Status: DC

## 2013-07-06 NOTE — Telephone Encounter (Signed)
rx for lyrica was sent to [facility] per pts request

## 2013-07-06 NOTE — Telephone Encounter (Signed)
He is supposed to be taking at least 2 of the 300 mg of gabapentin as needed. If this is not helping him we can start him on Lyrica 75 mg twice a day

## 2013-07-06 NOTE — Telephone Encounter (Signed)
Patient called and said the burning in his legs and feet is getting worse, they also feel hot. He said you told him to let you know if it was not getting any better.Cole Craig

## 2013-07-26 ENCOUNTER — Telehealth: Payer: Self-pay | Admitting: *Deleted

## 2013-07-26 MED ORDER — PANTOPRAZOLE SODIUM 40 MG PO TBEC: 40.0000 mg | DELAYED_RELEASE_TABLET | Freq: Every day | ORAL | Status: DC

## 2013-07-26 NOTE — Telephone Encounter (Signed)
Done

## 2013-08-03 ENCOUNTER — Telehealth: Payer: Self-pay | Admitting: *Deleted

## 2013-08-03 MED ORDER — PREGABALIN 75 MG PO CAPS: 75.0000 mg | ORAL_CAPSULE | Freq: Two times a day (BID) | ORAL | Status: DC

## 2013-08-03 NOTE — Telephone Encounter (Signed)
Patient was prescribed Lyrica back on July 10, rx was sent to mail order pharmacy but he was told they never received it, He's complaining of pins and needles type feeling in his legs, he said he has RLS and this is much worse and it's all the time, I explained to him that the Lyrica should help but he wants to double check with you, in case you think of something else that would be helpful to him.  Please advise

## 2013-08-03 NOTE — Telephone Encounter (Signed)
Please send a prescription for Lyrica 75 mg twice a day if the symptoms of pins and needles are significant. If he is having more problems with restless legs then he should check with PCP about a different medication

## 2013-08-21 ENCOUNTER — Other Ambulatory Visit: Payer: Self-pay | Admitting: *Deleted

## 2013-08-21 DIAGNOSIS — E119 Type 2 diabetes mellitus without complications: Secondary | ICD-10-CM

## 2013-08-21 DIAGNOSIS — E785 Hyperlipidemia, unspecified: Secondary | ICD-10-CM

## 2013-08-25 ENCOUNTER — Telehealth: Payer: Self-pay | Admitting: Endocrinology

## 2013-08-30 ENCOUNTER — Other Ambulatory Visit (INDEPENDENT_AMBULATORY_CARE_PROVIDER_SITE_OTHER): Payer: BC Managed Care – PPO

## 2013-08-30 DIAGNOSIS — E785 Hyperlipidemia, unspecified: Secondary | ICD-10-CM

## 2013-08-30 DIAGNOSIS — E119 Type 2 diabetes mellitus without complications: Secondary | ICD-10-CM

## 2013-08-30 LAB — COMPREHENSIVE METABOLIC PANEL
ALT: 27 U/L (ref 0–53)
AST: 24 U/L (ref 0–37)
Albumin: 4.2 g/dL (ref 3.5–5.2)
Alkaline Phosphatase: 61 U/L (ref 39–117)
BUN: 14 mg/dL (ref 6–23)
CO2: 27 mEq/L (ref 19–32)
Calcium: 9.3 mg/dL (ref 8.4–10.5)
Chloride: 102 mEq/L (ref 96–112)
Creatinine, Ser: 0.8 mg/dL (ref 0.4–1.5)
GFR: 121.32 mL/min (ref >60.00)
Glucose, Bld: 122 mg/dL — ABNORMAL HIGH (ref 70–99)
Potassium: 3.8 mEq/L (ref 3.5–5.1)
Sodium: 136 mEq/L (ref 135–145)
Total Bilirubin: 1 mg/dL (ref 0.3–1.2)
Total Protein: 7.1 g/dL (ref 6.0–8.3)

## 2013-08-30 LAB — URINALYSIS
Bilirubin Urine: NEGATIVE
Hgb urine dipstick: NEGATIVE
Ketones, ur: NEGATIVE
Leukocytes, UA: NEGATIVE
Nitrite: NEGATIVE
Specific Gravity, Urine: >=1.03 (ref 1.000–1.030)
Total Protein, Urine: NEGATIVE
Urine Glucose: NEGATIVE
Urobilinogen, UA: 0.2 (ref 0.0–1.0)
pH: 6 (ref 5.0–8.0)

## 2013-08-30 LAB — HEMOGLOBIN A1C: Hgb A1c MFr Bld: 6 % (ref 4.6–6.5)

## 2013-08-30 LAB — LIPID PANEL: HDL: 40.2 mg/dL (ref >39.00)

## 2013-08-30 LAB — MICROALBUMIN / CREATININE URINE RATIO
Creatinine,U: 150.5 mg/dL
Microalb Creat Ratio: 0.4 mg/g (ref 0.0–30.0)
Microalb, Ur: 0.6 mg/dL (ref 0.0–1.9)

## 2013-09-01 ENCOUNTER — Ambulatory Visit (INDEPENDENT_AMBULATORY_CARE_PROVIDER_SITE_OTHER): Payer: BC Managed Care – PPO | Admitting: Endocrinology

## 2013-09-01 ENCOUNTER — Telehealth: Payer: Self-pay | Admitting: Endocrinology

## 2013-09-01 ENCOUNTER — Encounter: Payer: Self-pay | Admitting: Endocrinology

## 2013-09-01 ENCOUNTER — Other Ambulatory Visit: Payer: Self-pay | Admitting: *Deleted

## 2013-09-01 VITALS — BP 120/78 | HR 76 | Temp 98.6°F | Resp 12 | Ht 68.0 in | Wt 143.1 lb

## 2013-09-01 DIAGNOSIS — E119 Type 2 diabetes mellitus without complications: Secondary | ICD-10-CM

## 2013-09-01 DIAGNOSIS — G609 Hereditary and idiopathic neuropathy, unspecified: Secondary | ICD-10-CM | POA: Insufficient documentation

## 2013-09-01 MED ORDER — L-METHYLFOLATE-B6-B12 2.8-25-2 MG PO TABS: 2.8000 mg | ORAL_TABLET | Freq: Two times a day (BID) | ORAL | Status: DC

## 2013-09-01 MED ORDER — ATORVASTATIN CALCIUM 40 MG PO TABS: 40.0000 mg | ORAL_TABLET | Freq: Every day | ORAL | Status: DC

## 2013-09-01 MED ORDER — PREGABALIN 75 MG PO CAPS: 75.0000 mg | ORAL_CAPSULE | Freq: Two times a day (BID) | ORAL | Status: DC

## 2013-09-01 NOTE — Progress Notes (Signed)
Patient ID: Cole Craig, male   DOB: 1971-06-08, 42 y.o.   MRN: [id number]  Cole Craig is an 42 y.o. male.   Reason for Appointment: Diabetes follow-up   History of Present Illness   Diagnosis: Type 2 DIABETES MELITUS, date of diagnosis: 2012       Usually has had relatively mild diabetes which has been generally well controlled with metformin and subsequently Actos added for optimal control He generally has fasting hyperglycemia but also has not been checking his blood sugars after meals on most of his visits as directed More recently his blood sugars are relatively better although still quite variable in the morning However his A1c is good  Oral hypoglycemic drugs: Metformin and Actos        Side effects from medications: None       Monitors blood glucose: Once a day.    Glucometer: One Touch.          Blood Glucose readings from meter download: readings before breakfast: 113-165 with median about 140, midday 127, 130 Lab glucose 122 fasting  Hypoglycemia frequency: Never.          Meals: 3 meals per day.          Physical activity: exercise: walking 2/7 days a week           Complications: are: Neuropathy    The last HbgA1c report is not available  NEUROPATHY: He thinks he has had symptoms of burning and sharp pains in his legs for the last 2 years, probably about the time of his diabetes diagnosis No symptoms of numbness He also now says that his aunt has had neuropathy without any known diabetes He has been treated with gabapentin empirically but even with taking the 900 mg dose at bedtime he is still having difficulty sleeping at night on most of the days He has not picked up his prescription for Lyrica which was sent last month  Wt Readings from Last 3 Encounters:  09/01/13 143 lb 1.6 oz (64.91 kg)  03/19/12 140 lb 6.9 oz (63.7 kg)  08/16/09 152 lb (68.947 kg)    Appointment on 08/30/2013  Component Date Value Range Status  . Cholesterol 08/30/2013 123  0 - 200  mg/dL Final   ATP III Classification       Desirable:  < 200 mg/dL               Borderline High:  200 - 239 mg/dL          High:  > = 409 mg/dL  . Triglycerides 08/30/2013 117.0  0.0 - 149.0 mg/dL Final   Normal:  <811 mg/dLBorderline High:  150 - 199 mg/dL  . HDL 08/30/2013 40.20  >39.00 mg/dL Final  . VLDL 91/47/8295 23.4  0.0 - 40.0 mg/dL Final  . LDL Cholesterol 08/30/2013 59  0 - 99 mg/dL Final  . Total CHOL/HDL Ratio 08/30/2013 3   Final                  Men          Women1/2 Average Risk     3.4          3.3Average Risk          5.0          4.42X Average Risk          9.6          7.13X Average Risk  15.0          11.0                      . Hemoglobin A1C 08/30/2013 6.0  4.6 - 6.5 % Final   Glycemic Control Guidelines for People with Diabetes:Non Diabetic:  <6%Goal of Therapy: <7%Additional Action Suggested:  >8%   . Sodium 08/30/2013 136  135 - 145 mEq/L Final  . Potassium 08/30/2013 3.8  3.5 - 5.1 mEq/L Final  . Chloride 08/30/2013 102  96 - 112 mEq/L Final  . CO2 08/30/2013 27  19 - 32 mEq/L Final  . Glucose, Bld 08/30/2013 122* 70 - 99 mg/dL Final  . BUN 40/98/1191 14  6 - 23 mg/dL Final  . Creatinine, Ser 08/30/2013 0.8  0.4 - 1.5 mg/dL Final  . Total Bilirubin 08/30/2013 1.0  0.3 - 1.2 mg/dL Final  . Alkaline Phosphatase 08/30/2013 61  39 - 117 U/L Final  . AST 08/30/2013 24  0 - 37 U/L Final  . ALT 08/30/2013 27  0 - 53 U/L Final  . Total Protein 08/30/2013 7.1  6.0 - 8.3 g/dL Final  . Albumin 47/82/9562 4.2  3.5 - 5.2 g/dL Final  . Calcium 13/07/6577 9.3  8.4 - 10.5 mg/dL Final  . GFR 46/96/2952 121.32  >60.00 mL/min Final  . Color, Urine 08/30/2013 LT. YELLOW  Yellow;Lt. Yellow Final  . APPearance 08/30/2013 CLEAR  Clear Final  . Specific Gravity, Urine 08/30/2013 >=1.030  1.000 - 1.030 Final  . pH 08/30/2013 6.0  5.0 - 8.0 Final  . Total Protein, Urine 08/30/2013 NEGATIVE  Negative Final  . Urine Glucose 08/30/2013 NEGATIVE  Negative Final  . Ketones, ur  08/30/2013 NEGATIVE  Negative Final  . Bilirubin Urine 08/30/2013 NEGATIVE  Negative Final  . Hgb urine dipstick 08/30/2013 NEGATIVE  Negative Final  . Urobilinogen, UA 08/30/2013 0.2  0.0 - 1.0 Final  . Leukocytes, UA 08/30/2013 NEGATIVE  Negative Final  . Nitrite 08/30/2013 NEGATIVE  Negative Final  . Microalb, Ur 08/30/2013 0.6  0.0 - 1.9 mg/dL Final  . Creatinine,U 84/13/2440 150.5   Final  . Microalb Creat Ratio 08/30/2013 0.4  0.0 - 30.0 mg/g Final      Medication List       This list is accurate as of: 09/01/13  9:30 AM.  Always use your most recent med list.               metFORMIN 750 MG 24 hr tablet  Commonly known as:  GLUCOPHAGE-XR  Take 750 mg by mouth daily with breakfast.     pantoprazole 40 MG tablet  Commonly known as:  PROTONIX  Take 1 tablet (40 mg total) by mouth daily.     pioglitazone 15 MG tablet  Commonly known as:  ACTOS  Take 15 mg by mouth daily.     pregabalin 75 MG capsule  Commonly known as:  LYRICA  Take 1 capsule (75 mg total) by mouth 2 (two) times daily.        Allergies:  Allergies  Allergen Reactions  . Iohexol      Code: RASH, Desc: RASH AND TONGUE SWELLING     Past Medical History  Diagnosis Date  . Diabetes mellitus   . Pancreatitis   . Pancreatic cystadenoma   . Hyperlipidemia   . GERD (gastroesophageal reflux disease)     Past Surgical History  Procedure Laterality Date  . Pancreatitis surgery  circa 2003 in [place], [place]  . Hernia repair    . Cholecystectomy      circa 2003 in [place], [place]    Family History  Problem Relation Age of Onset  . Arthritis Sister     Social History:  reports that he quit smoking about 20 months ago. His smoking use included Cigars. He has never used smokeless tobacco. He reports that he does not drink alcohol or use illicit drugs.  Review of Systems:  No history of hypertension Has history of pancreatitis and reflux, nose symptoms of abdominal pain  now  HYPERLIPIDEMIA: The lipid abnormality consists of elevated LDL treated with Lipitor, LDL 59, HDL 40.     Examination:   BP 120/78  Pulse 76  Temp(Src) 98.6 F (37 C)  Resp 12  Ht 5\' 8"  (1.727 m)  Wt 143 lb 1.6 oz (64.91 kg)  BMI 21.76 kg/m2  SpO2 98%  Body mass index is 21.76 kg/(m^2).   No ankle edema present  ASSESSMENT/ PLAN::   Diabetes type 2   The patient's diabetes control appears to be overall well controlled even though he has some fasting hyperglycemia A1c is upper normal. Again emphasized the need for checking readings after meals periodically which he has not been doing He will need to try to increase exercise also although he apparently has a busy work schedule. For now will add another metformin tablet at bedtime since he is not on maximal doses but would consider increasing Actos also if blood sugars are higher   NEUROPATHY: Not clear this is all diabetes related. Discussed that he should review this with PCP and consider neurology consultation Meanwhile he will try Lyrica instead of gabapentin and take 150 mg an hour before bedtime Also will empirically try him on an B. vitamin supplement, Metanx  Edilia Ghuman 09/01/2013, 9:30 AM

## 2013-09-01 NOTE — Telephone Encounter (Signed)
Pt says he called 2 days ago, still has not gotten correct meds. Please call pt / Sherri S.

## 2013-09-01 NOTE — Patient Instructions (Addendum)
Metanx 2 times daily  Lyrica 150mg  1 hr before sleep  Please check blood sugars at least half the time about 2 hours after any meal and as directed on waking up. Please bring blood sugar monitor to each visit  Extra 500 Metformin at dinner  Walk daily

## 2013-12-04 ENCOUNTER — Other Ambulatory Visit: Payer: Self-pay | Admitting: *Deleted

## 2013-12-04 MED ORDER — METFORMIN HCL ER 750 MG PO TB24: 750.0000 mg | ORAL_TABLET | Freq: Every day | ORAL | Status: DC

## 2013-12-04 MED ORDER — PIOGLITAZONE HCL 30 MG PO TABS: 30.0000 mg | ORAL_TABLET | Freq: Every day | ORAL | Status: DC

## 2013-12-04 MED ORDER — GLUCOSE BLOOD VI STRP: ORAL_STRIP | Status: DC

## 2014-04-25 ENCOUNTER — Other Ambulatory Visit: Payer: Self-pay | Admitting: *Deleted

## 2014-04-25 MED ORDER — ONETOUCH ULTRA 2 W/DEVICE KIT: PACK | Status: DC

## 2014-08-28 ENCOUNTER — Other Ambulatory Visit: Payer: Self-pay | Admitting: Endocrinology

## 2014-08-28 ENCOUNTER — Telehealth: Payer: Self-pay | Admitting: Endocrinology

## 2014-08-28 NOTE — Telephone Encounter (Signed)
Patient stated that [facility] will be faxing over a prescription refill for patient,. Patient also stated that his legs were bothering him and they are getting worse.  Please advise

## 2014-08-28 NOTE — Telephone Encounter (Signed)
Please call patient to schedule appointment to be seen, he was due for ov in January

## 2014-08-30 ENCOUNTER — Other Ambulatory Visit: Payer: Self-pay | Admitting: *Deleted

## 2014-08-30 MED ORDER — PANTOPRAZOLE SODIUM 40 MG PO TBEC: 40.0000 mg | DELAYED_RELEASE_TABLET | Freq: Every day | ORAL | Status: DC

## 2014-08-30 MED ORDER — METFORMIN HCL ER 750 MG PO TB24: 750.0000 mg | ORAL_TABLET | Freq: Every day | ORAL | Status: DC

## 2014-08-30 NOTE — Telephone Encounter (Signed)
Patient ask if you would call him. please °

## 2014-09-28 ENCOUNTER — Other Ambulatory Visit: Payer: Self-pay | Admitting: *Deleted

## 2014-09-28 ENCOUNTER — Ambulatory Visit (INDEPENDENT_AMBULATORY_CARE_PROVIDER_SITE_OTHER): Payer: BC Managed Care – PPO | Admitting: Endocrinology

## 2014-09-28 ENCOUNTER — Encounter: Payer: Self-pay | Admitting: Endocrinology

## 2014-09-28 VITALS — BP 124/72 | HR 60 | Temp 98.1°F | Resp 14 | Ht 68.0 in | Wt 148.4 lb

## 2014-09-28 DIAGNOSIS — E785 Hyperlipidemia, unspecified: Secondary | ICD-10-CM

## 2014-09-28 DIAGNOSIS — E1140 Type 2 diabetes mellitus with diabetic neuropathy, unspecified: Secondary | ICD-10-CM

## 2014-09-28 DIAGNOSIS — Z23 Encounter for immunization: Secondary | ICD-10-CM

## 2014-09-28 LAB — COMPREHENSIVE METABOLIC PANEL
ALK PHOS: 79 U/L (ref 39–117)
ALT: 18 U/L (ref 0–53)
AST: 23 U/L (ref 0–37)
Albumin: 4.5 g/dL (ref 3.5–5.2)
BUN: 15 mg/dL (ref 6–23)
CO2: 28 mEq/L (ref 19–32)
Calcium: 9.4 mg/dL (ref 8.4–10.5)
Chloride: 102 mEq/L (ref 96–112)
Creatinine, Ser: 0.7 mg/dL (ref 0.4–1.5)
GFR: 122.58 mL/min (ref >60.00)
Glucose, Bld: 117 mg/dL — ABNORMAL HIGH (ref 70–99)
Potassium: 4.1 mEq/L (ref 3.5–5.1)
Sodium: 135 mEq/L (ref 135–145)
Total Bilirubin: 0.7 mg/dL (ref 0.2–1.2)
Total Protein: 7.8 g/dL (ref 6.0–8.3)

## 2014-09-28 LAB — HEMOGLOBIN A1C: Hgb A1c MFr Bld: 5.6 % (ref 4.6–6.5)

## 2014-09-28 LAB — MICROALBUMIN / CREATININE URINE RATIO
Creatinine,U: 49.2 mg/dL
MICROALB/CREAT RATIO: 0.4 mg/g (ref 0.0–30.0)
Microalb, Ur: 0.2 mg/dL (ref 0.0–1.9)

## 2014-09-28 LAB — LIPID PANEL
Cholesterol: 139 mg/dL (ref 0–200)
HDL: 37.6 mg/dL — AB (ref >39.00)
LDL Cholesterol: 78 mg/dL (ref 0–99)
NonHDL: 101.4
Total CHOL/HDL Ratio: 4
Triglycerides: 117 mg/dL (ref 0.0–149.0)
VLDL: 23.4 mg/dL (ref 0.0–40.0)

## 2014-09-28 MED ORDER — PREGABALIN 75 MG PO CAPS: 75.0000 mg | ORAL_CAPSULE | Freq: Two times a day (BID) | ORAL | Status: DC

## 2014-09-28 MED ORDER — METFORMIN HCL ER 750 MG PO TB24: 1500.0000 mg | ORAL_TABLET | Freq: Every day | ORAL | Status: DC

## 2014-09-28 NOTE — Patient Instructions (Signed)
Please check blood sugars at least half the time about 2 hours after any meal and times per week on waking up. Please bring blood sugar monitor to each visit  Take Lyrica 150mg  at bedtime

## 2014-09-28 NOTE — Progress Notes (Signed)
Patient ID: Cole Craig, male   DOB: 09-28-1971, 43 y.o.   MRN: [id number]    Reason for Appointment: Diabetes follow-up   History of Present Illness   Diagnosis: Type 2 DIABETES MELITUS, date of diagnosis: 2012       Previously has had relatively mild diabetes which has been generally well controlled with low-dose metformin and subsequently Actos added for optimal control He generally has mostly fasting hyperglycemia but also has not been checking his blood sugars after meals on most of his visits as directed He has not been seen in followup for a year and no recent A1c available More recently his blood sugars are slightly better on an average compared to his previous visit although still quite variable in the morning He was told to increase his metformin to 2 tablets on the last visit but he did not do so Also he has started doing some regular exercise which he was not doing before He has gained some weight but his BMI is only 22.5 currently  Oral hypoglycemic drugs: Metformin 750 and Actos   30 mg       Side effects from medications: None       Monitors blood glucose: Once a day.    Glucometer: One Touch.          Blood Glucose readings from meter download:  BEFORE breakfast: 117-155 with median 133  Hypoglycemia frequency: Never.          Meals: 3 meals per day.          Physical activity: exercise: walking 20-25 min, 3/7 days a week           Complications: are: Neuropathy    The last HbgA1c report is not available   Wt Readings from Last 3 Encounters:  09/28/14 148 lb 6.4 oz (67.314 kg)  09/01/13 143 lb 1.6 oz (64.91 kg)  03/19/12 140 lb 6.9 oz (63.7 kg)   Lab Results  Component Value Date   HGBA1C 6.0 08/30/2013   HGBA1C 6.0* 03/15/2012   Lab Results  Component Value Date   MICROALBUR 0.6 08/30/2013   LDLCALC 59 08/30/2013   CREATININE 0.8 08/30/2013    No visits with results within 1 Week(s) from this visit. Latest known visit with results is:  Appointment on  08/30/2013  Component Date Value Ref Range Status  . Cholesterol 08/30/2013 123  0 - 200 mg/dL Final   ATP III Classification       Desirable:  < 200 mg/dL               Borderline High:  200 - 239 mg/dL          High:  > = 240 mg/dL  . Triglycerides 08/30/2013 117.0  0.0 - 149.0 mg/dL Final   Normal:  <150 mg/dLBorderline High:  150 - 199 mg/dL  . HDL 08/30/2013 40.20  >39.00 mg/dL Final  . VLDL 08/30/2013 23.4  0.0 - 40.0 mg/dL Final  . LDL Cholesterol 08/30/2013 59  0 - 99 mg/dL Final  . Total CHOL/HDL Ratio 08/30/2013 3   Final                  Men          Women1/2 Average Risk     3.4          3.3Average Risk          5.0          4.42X Average Risk  9.6          7.13X Average Risk          15.0          11.0                      . Hemoglobin A1C 08/30/2013 6.0  4.6 - 6.5 % Final   Glycemic Control Guidelines for People with Diabetes:Non Diabetic:  <6%Goal of Therapy: <7%Additional Action Suggested:  >8%   . Sodium 08/30/2013 136  135 - 145 mEq/L Final  . Potassium 08/30/2013 3.8  3.5 - 5.1 mEq/L Final  . Chloride 08/30/2013 102  96 - 112 mEq/L Final  . CO2 08/30/2013 27  19 - 32 mEq/L Final  . Glucose, Bld 08/30/2013 122* 70 - 99 mg/dL Final  . BUN 08/30/2013 14  6 - 23 mg/dL Final  . Creatinine, Ser 08/30/2013 0.8  0.4 - 1.5 mg/dL Final  . Total Bilirubin 08/30/2013 1.0  0.3 - 1.2 mg/dL Final  . Alkaline Phosphatase 08/30/2013 61  39 - 117 U/L Final  . AST 08/30/2013 24  0 - 37 U/L Final  . ALT 08/30/2013 27  0 - 53 U/L Final  . Total Protein 08/30/2013 7.1  6.0 - 8.3 g/dL Final  . Albumin 08/30/2013 4.2  3.5 - 5.2 g/dL Final  . Calcium 08/30/2013 9.3  8.4 - 10.5 mg/dL Final  . GFR 08/30/2013 121.32  >60.00 mL/min Final  . Color, Urine 08/30/2013 LT. YELLOW  Yellow;Lt. Yellow Final  . APPearance 08/30/2013 CLEAR  Clear Final  . Specific Gravity, Urine 08/30/2013 >=1.030  1.000 - 1.030 Final  . pH 08/30/2013 6.0  5.0 - 8.0 Final  . Total Protein, Urine 08/30/2013  NEGATIVE  Negative Final  . Urine Glucose 08/30/2013 NEGATIVE  Negative Final  . Ketones, ur 08/30/2013 NEGATIVE  Negative Final  . Bilirubin Urine 08/30/2013 NEGATIVE  Negative Final  . Hgb urine dipstick 08/30/2013 NEGATIVE  Negative Final  . Urobilinogen, UA 08/30/2013 0.2  0.0 - 1.0 Final  . Leukocytes, UA 08/30/2013 NEGATIVE  Negative Final  . Nitrite 08/30/2013 NEGATIVE  Negative Final  . Microalb, Ur 08/30/2013 0.6  0.0 - 1.9 mg/dL Final  . Creatinine,U 08/30/2013 150.5   Final  . Microalb Creat Ratio 08/30/2013 0.4  0.0 - 30.0 mg/g Final      Medication List       This list is accurate as of: 09/28/14 10:48 AM.  Always use your most recent med list.               atorvastatin 40 MG tablet  Commonly known as:  LIPITOR  Take 1 tablet (40 mg total) by mouth daily.     gabapentin 300 MG capsule  Commonly known as:  NEURONTIN  Take 300 mg by mouth 3 (three) times daily.     glucose blood test strip  Commonly known as:  ONE TOUCH ULTRA TEST  Use as instructed to check blood sugars 3 times per day     L-Methylfolate-B6-B12 2.8-25-2 MG Tabs  Take 2.8 mg by mouth 2 (two) times daily.     metFORMIN 750 MG 24 hr tablet  Commonly known as:  GLUCOPHAGE-XR  Take 1 tablet (750 mg total) by mouth daily with breakfast.     ONE TOUCH ULTRA 2 W/DEVICE Kit  Use to check blood sugar 3 times per day dx code 250.00     pantoprazole 40 MG tablet  Commonly known as:  PROTONIX  Take 1 tablet (40 mg total) by mouth daily.     pioglitazone 30 MG tablet  Commonly known as:  ACTOS  Take 1 tablet (30 mg total) by mouth daily.     pregabalin 75 MG capsule  Commonly known as:  LYRICA  Take 1 capsule (75 mg total) by mouth 2 (two) times daily.     Vitamin D3 2000 UNITS Tabs  Take 2,000 Units by mouth 2 (two) times daily.     ZENPEP 20000 UNITS Cpep  Generic drug:  Pancrelipase (Lip-Prot-Amyl)  Take 25,000 Units by mouth.        Allergies:  Allergies  Allergen Reactions  .  Iohexol      Code: RASH, Desc: RASH AND TONGUE SWELLING     Past Medical History  Diagnosis Date  . Diabetes mellitus   . Pancreatitis   . Pancreatic cystadenoma   . Hyperlipidemia   . GERD (gastroesophageal reflux disease)     Past Surgical History  Procedure Laterality Date  . Pancreatitis surgery       circa 2003 in [place], [place]  . Hernia repair    . Cholecystectomy      circa 2003 in [place], [place]    Family History  Problem Relation Age of Onset  . Arthritis Sister     Social History:  reports that he quit smoking about 2 years ago. His smoking use included Cigars. He has never used smokeless tobacco. He reports that he does not drink alcohol or use illicit drugs.  Review of Systems:  No history of hypertension Has history of pancreatitis and reflux, no  symptoms of abdominal pain now but still taking pancreatic enzymes  HYPERLIPIDEMIA: The lipid abnormality consists of elevated LDL treated with Lipitor, previously LDL 59, HDL 40.  NEUROPATHY:  He  has had symptoms of burning and sharp pains in his legs for the last 3 years, probably about the time of his diabetes diagnosis No symptoms of numbness He has been treated with gabapentin empirically but even with taking the 900 mg dose at bedtime he is not getting relief He tried taking some Lyrica that he brought from [place] and he thinks that taking this twice a day at 75 mg was helpful His symptoms are usually only present at night Previously had been given Lyrica prescription but he did not pick this up Also was given a generic form of Metanx on the last visit and he did not try this     Examination:   BP 124/72  Pulse 60  Temp(Src) 98.1 F (36.7 C)  Resp 14  Ht $R'5\' 8"'xd$  (1.727 m)  Wt 148 lb 6.4 oz (67.314 kg)  BMI 22.57 kg/m2  SpO2 98%  Body mass index is 22.57 kg/(m^2).   No ankle edema present Diabetic foot exam shows normal monofilament sensation in the toes and plantar surfaces, no skin lesions or  ulcers on the feet and normal pedal pulses   ASSESSMENT/ PLAN:   Diabetes type 2, not obese   The patient's diabetes control appears to be about the same even though he has some fasting hyperglycemia; again has not checked blood sugars after meals A1c needs to be done to confirm overall control Again emphasized the need for checking readings after meals periodically which he has not been doing to help with dietary compliance and balanced meals For now will add another 750 mg metformin tablet at bedtime since he is not on maximal doses and this should  have benefit on fasting reading, discussed effects on hepatic glucose production  NEUROPATHY: Not clear if this is all diabetes related since the onset was at the time of diagnosis or before He will try Lyrica instead of gabapentin and take 150 mg an hour before bedtime  Hyperlipidemia: LDL to be rechecked today and results will be forwarded to PCP  Counseling time over 50% of today's 25 minute visit   Mava Suares 09/28/2014, 10:48 AM   Addendum: A1c is excellent at 5.6 and lipids are overall normal with low HDL  Office Visit on 09/28/2014  Component Date Value Ref Range Status  . Sodium 09/28/2014 135  135 - 145 mEq/L Final  . Potassium 09/28/2014 4.1  3.5 - 5.1 mEq/L Final  . Chloride 09/28/2014 102  96 - 112 mEq/L Final  . CO2 09/28/2014 28  19 - 32 mEq/L Final  . Glucose, Bld 09/28/2014 117* 70 - 99 mg/dL Final  . BUN 09/28/2014 15  6 - 23 mg/dL Final  . Creatinine, Ser 09/28/2014 0.7  0.4 - 1.5 mg/dL Final  . Total Bilirubin 09/28/2014 0.7  0.2 - 1.2 mg/dL Final  . Alkaline Phosphatase 09/28/2014 79  39 - 117 U/L Final  . AST 09/28/2014 23  0 - 37 U/L Final  . ALT 09/28/2014 18  0 - 53 U/L Final  . Total Protein 09/28/2014 7.8  6.0 - 8.3 g/dL Final  . Albumin 09/28/2014 4.5  3.5 - 5.2 g/dL Final  . Calcium 09/28/2014 9.4  8.4 - 10.5 mg/dL Final  . GFR 09/28/2014 122.58  >60.00 mL/min Final  . Microalb, Ur 09/28/2014 0.2  0.0  - 1.9 mg/dL Final  . Creatinine,U 09/28/2014 49.2   Final  . Microalb Creat Ratio 09/28/2014 0.4  0.0 - 30.0 mg/g Final  . Cholesterol 09/28/2014 139  0 - 200 mg/dL Final   ATP III Classification       Desirable:  < 200 mg/dL               Borderline High:  200 - 239 mg/dL          High:  > = 240 mg/dL  . Triglycerides 09/28/2014 117.0  0.0 - 149.0 mg/dL Final   Normal:  <150 mg/dLBorderline High:  150 - 199 mg/dL  . HDL 09/28/2014 37.60* >39.00 mg/dL Final  . VLDL 09/28/2014 23.4  0.0 - 40.0 mg/dL Final  . LDL Cholesterol 09/28/2014 78  0 - 99 mg/dL Final  . Total CHOL/HDL Ratio 09/28/2014 4   Final                  Men          Women1/2 Average Risk     3.4          3.3Average Risk          5.0          4.42X Average Risk          9.6          7.13X Average Risk          15.0          11.0                      . NonHDL 09/28/2014 101.40   Final   NOTE:  Non-HDL goal should be 30 mg/dL higher than patient's LDL goal (i.e. LDL goal of < 70 mg/dL, would have non-HDL goal of < 100  mg/dL)  . Hemoglobin A1C 09/28/2014 5.6  4.6 - 6.5 % Final   Glycemic Control Guidelines for People with Diabetes:Non Diabetic:  <6%Goal of Therapy: <7%Additional Action Suggested:  >8%

## 2014-09-30 NOTE — Progress Notes (Signed)
Quick Note:  Please let patient know that the A1c is good at 5.6, good cholesterol slightly low ______

## 2014-10-12 ENCOUNTER — Encounter (INDEPENDENT_AMBULATORY_CARE_PROVIDER_SITE_OTHER): Payer: Self-pay

## 2014-10-12 ENCOUNTER — Ambulatory Visit (INDEPENDENT_AMBULATORY_CARE_PROVIDER_SITE_OTHER): Payer: BC Managed Care – PPO | Admitting: Diagnostic Neuroimaging

## 2014-10-12 ENCOUNTER — Encounter: Payer: Self-pay | Admitting: Diagnostic Neuroimaging

## 2014-10-12 VITALS — BP 109/73 | HR 62 | Ht 66.0 in | Wt 148.0 lb

## 2014-10-12 DIAGNOSIS — G2581 Restless legs syndrome: Secondary | ICD-10-CM

## 2014-10-12 DIAGNOSIS — M79606 Pain in leg, unspecified: Secondary | ICD-10-CM

## 2014-10-12 DIAGNOSIS — R208 Other disturbances of skin sensation: Secondary | ICD-10-CM

## 2014-10-12 DIAGNOSIS — R200 Anesthesia of skin: Secondary | ICD-10-CM

## 2014-10-12 NOTE — Patient Instructions (Signed)
Restless Legs Syndrome Restless legs syndrome is a movement disorder. It may also be called a sensorimotor disorder.  CAUSES  No one knows what specifically causes restless legs syndrome, but it tends to run in families. It is also more common in people with low iron, in pregnancy, in people who need dialysis, and those with nerve damage (neuropathy).Some medications may make restless legs syndrome worse.Those medications include drugs to treat high blood pressure, some heart conditions, nausea, colds, allergies, and depression. SYMPTOMS Symptoms include uncomfortable sensations in the legs. These leg sensations are worse during periods of inactivity or rest. They are also worse while sitting or lying down. Individuals that have the disorder describe sensations in the legs that feel like:  Pulling.  Drawing.  Crawling.  Worming.  Boring.  Tingling.  Pins and needles.  Prickling.  Pain. The sensations are usually accompanied by an overwhelming urge to move the legs. Sudden muscle jerks may also occur. Movement provides temporary relief from the discomfort. In rare cases, the arms may also be affected. Symptoms may interfere with going to sleep (sleep onset insomnia). Restless legs syndrome may also be related to periodic limb movement disorder (PLMD). PLMD is another more common motor disorder. It also causes interrupted sleep. The symptoms from PLMD usually occur most often when you are awake. TREATMENT  Treatment for restless legs syndrome is symptomatic. This means that the symptoms are treated.   Massage and cold compresses may provide temporary relief.  Walk, stretch, or take a cold or hot bath.  Get regular exercise and a good night's sleep.  Avoid caffeine, alcohol, nicotine, and medications that can make it worse.  Do activities that provide mental stimulation like discussions, needlework, and video games. These may be helpful if you are not able to walk or stretch. Some  medications are effective in relieving the symptoms. However, many of these medications have side effects. Ask your caregiver about medications that may help your symptoms. Correcting iron deficiency may improve symptoms for some patients. Document Released: 12/04/2002 Document Revised: 04/30/2014 Document Reviewed: 03/12/2011 ExitCare Patient Information 2015 [employer]. This information is not intended to replace advice given to you by your health care provider. Make sure you discuss any questions you have with your health care provider.  

## 2014-10-12 NOTE — Progress Notes (Signed)
[facility]  PATIENT: Cole Craig DOB: [birth year]  REFERRING CLINICIAN: Drema Dallas HISTORY FROM: patient  REASON FOR VISIT: new consult   HISTORICAL  CHIEF COMPLAINT:  Chief Complaint  Patient presents with  . Peripheral Neuropathy    # 7    New Patient -  Vision 20/30 both W/o    HISTORY OF PRESENT ILLNESS:   43 year old right-handed male here for evaluation of numbness and pain in the legs. Symptoms started around 2012. Patient describes intermittent shooting stabbing pain in his thighs, calves, feet, typically 30 minutes after sitting or laying down in the evening. He does not have symptoms in the morning or afternoon. Symptoms are slightly relieved by standing up and walking. Sometimes he hits, bangs, squeezes his legs to try to relieve symptoms. Patient's aunt and brother have similar symptoms.  Of note patient has history of pancreatitis in 2003 and 2013. Patient was diagnosed with diabetes approximately 3 years ago. Recent hemoglobin A1c is 5.7. Leg symptoms started around the time of diagnosis of diabetes. Unclear if patient had undiagnosed diabetes several years prior to onset of symptoms in the legs.  Patient has been on Lyrica 150 mg in the evening at 8 PM for past one week, with mild improvement in symptom control.   REVIEW OF SYSTEMS: Full 14 system review of systems performed and notable only for snoring.  ALLERGIES: Allergies  Allergen Reactions  . Iohexol      Code: RASH, Desc: RASH AND TONGUE SWELLING   . Ivp Dye [Iodinated Diagnostic Agents]     HOME MEDICATIONS: Outpatient Prescriptions Prior to Visit  Medication Sig Dispense Refill  . Blood Glucose Monitoring Suppl (ONE TOUCH ULTRA 2) W/DEVICE KIT Use to check blood sugar 3 times per day dx code 250.00  1 each  0  . Cholecalciferol (VITAMIN D3) 2000 UNITS TABS Take 2,000 Units by mouth 2 (two) times daily.      Marland Kitchen glucose blood (ONE TOUCH ULTRA TEST) test strip Use as instructed to  check blood sugars 3 times per day  300 each  3  . metFORMIN (GLUCOPHAGE-XR) 750 MG 24 hr tablet Take 2 tablets (1,500 mg total) by mouth daily with breakfast.  180 tablet  1  . Pancrelipase, Lip-Prot-Amyl, (ZENPEP) 20000 UNITS CPEP Take 25,000 Units by mouth.      . pantoprazole (PROTONIX) 40 MG tablet Take 1 tablet (40 mg total) by mouth daily.  90 tablet  1  . pioglitazone (ACTOS) 30 MG tablet Take 1 tablet (30 mg total) by mouth daily.  90 tablet  3  . pregabalin (LYRICA) 75 MG capsule Take 1 capsule (75 mg total) by mouth 2 (two) times daily.  180 capsule  1  . atorvastatin (LIPITOR) 40 MG tablet Take 1 tablet (40 mg total) by mouth daily.  90 tablet  3  . L-Methylfolate-B6-B12 2.8-25-2 MG TABS Take 2.8 mg by mouth 2 (two) times daily.  60 each  6   No facility-administered medications prior to visit.    PAST MEDICAL HISTORY: Past Medical History  Diagnosis Date  . Diabetes mellitus   . Pancreatitis   . Pancreatic cystadenoma   . Hyperlipidemia   . GERD (gastroesophageal reflux disease)     PAST SURGICAL HISTORY: Past Surgical History  Procedure Laterality Date  . Pancreatitis surgery       circa 2003 in [place], [place]  . Hernia repair    . Cholecystectomy      circa 2003 in [place],  [place]    FAMILY HISTORY: Family History  Problem Relation Age of Onset  . Arthritis Sister     SOCIAL HISTORY:  History   Social History  . Marital Status: Married    Spouse Name: N/A    Number of Children: 2  . Years of Education: college   Occupational History  . Systems developer     self employed  .     Social History Main Topics  . Smoking status: Former Smoker    Types: Cigars    Quit date: 12/29/2011  . Smokeless tobacco: Never Used  . Alcohol Use: 0.6 oz/week    1 Glasses of wine per week  . Drug Use: No  . Sexual Activity: No   Other Topics Concern  . Not on file   Social History Narrative   Patient lives at home with his wife Ardyth Man).   Patient  works full time at [employer].   Education college    Right handed.   Caffeine two cups daily.           PHYSICAL EXAM  Filed Vitals:   10/12/14 0942  BP: 109/73  Pulse: 62  Height: '5\' 6"'  (1.676 m)  Weight: 148 lb (67.132 kg)    Not recorded    Visual Acuity Screening   Right eye Left eye Both eyes  Without correction: 20/30 20/30   With correction:        Body mass index is 23.9 kg/(m^2).  GENERAL EXAM: Patient is in no distress; well developed, nourished and groomed; neck is supple  CARDIOVASCULAR: Regular rate and rhythm, no murmurs, no carotid bruits  NEUROLOGIC: MENTAL STATUS: awake, alert, oriented to person, place and time, recent and remote memory intact, normal attention and concentration, language fluent, comprehension intact, naming intact, fund of knowledge appropriate CRANIAL NERVE: no papilledema on fundoscopic exam, pupils equal and reactive to light, visual fields full to confrontation, extraocular muscles intact, no nystagmus, facial sensation and strength symmetric, hearing intact, palate elevates symmetrically, uvula midline, shoulder shrug symmetric, tongue midline. MOTOR: normal bulk and tone, full strength in the BUE, BLE SENSORY: normal and symmetric to light touch, pinprick, temperature, vibration and proprioception COORDINATION: finger-nose-finger, fine finger movements normal REFLEXES: BUE 1, KNEES TRACE, ANKLES 0; DOWN GOING TOES GAIT/STATION: narrow based gait; able to walk on toes, heels and tandem; romberg is negative    DIAGNOSTIC DATA (LABS, IMAGING, TESTING) - I reviewed patient records, labs, notes, testing and imaging myself where available.  Lab Results  Component Value Date   WBC 7.4 03/17/2012   HGB 12.4* 03/17/2012   HCT 37.2* 03/17/2012   MCV 91.9 03/17/2012   PLT 329 03/17/2012      Component Value Date/Time   NA 135 09/28/2014 1121   K 4.1 09/28/2014 1121   CL 102 09/28/2014 1121   CO2 28 09/28/2014 1121   GLUCOSE 117* 09/28/2014  1121   BUN 15 09/28/2014 1121   CREATININE 0.7 09/28/2014 1121   CALCIUM 9.4 09/28/2014 1121   PROT 7.8 09/28/2014 1121   ALBUMIN 4.5 09/28/2014 1121   AST 23 09/28/2014 1121   ALT 18 09/28/2014 1121   ALKPHOS 79 09/28/2014 1121   BILITOT 0.7 09/28/2014 1121   GFRNONAA >90 03/18/2012 0355   GFRAA >90 03/18/2012 0355   Lab Results  Component Value Date   CHOL 139 09/28/2014   HDL 37.60* 09/28/2014   LDLCALC 78 09/28/2014   TRIG 117.0 09/28/2014   CHOLHDL 4 09/28/2014   Lab  Results  Component Value Date   HGBA1C 5.6 09/28/2014   No results found for this basename: VITAMINB12   Lab Results  Component Value Date   TSH 1.02 07/05/2007      ASSESSMENT AND PLAN  43 y.o. year old male here with progressive intermittent shooting, numb, painful sensations in the legs, typically in the evening after laying down. Symptoms slightly improved by standing and walking. Findings are suspicious for restless leg syndrome. Underlying neuropathy, lumbar radiculopathy are also possible. Recent hemoglobin A1c, TSH, B12 have been unremarkable.  PLAN: - EMG/NCS - then may consider addl neuropathy labs and MRI lumbar spine - agree with lyrica 172m qhs; may need to increase to 1522mBID -  Could consider ropinirole, pramipexole or neupro patch in future  Orders Placed This Encounter  Procedures  . NCV with EMG(electromyography)   Return for for NCV/EMG.    VIPenni BombardMD [id number] Certified in Neurology, Neurophysiology and Neuroimaging  [facility] [id number] [phone]

## 2014-10-23 ENCOUNTER — Encounter (INDEPENDENT_AMBULATORY_CARE_PROVIDER_SITE_OTHER): Payer: Self-pay

## 2014-10-23 ENCOUNTER — Ambulatory Visit (INDEPENDENT_AMBULATORY_CARE_PROVIDER_SITE_OTHER): Payer: BC Managed Care – PPO | Admitting: Diagnostic Neuroimaging

## 2014-10-23 DIAGNOSIS — R208 Other disturbances of skin sensation: Secondary | ICD-10-CM

## 2014-10-23 DIAGNOSIS — Z0289 Encounter for other administrative examinations: Secondary | ICD-10-CM

## 2014-10-23 DIAGNOSIS — G2581 Restless legs syndrome: Secondary | ICD-10-CM

## 2014-10-23 DIAGNOSIS — R200 Anesthesia of skin: Secondary | ICD-10-CM

## 2014-10-23 DIAGNOSIS — M79606 Pain in leg, unspecified: Secondary | ICD-10-CM

## 2014-10-24 NOTE — Procedures (Signed)
   [facility]  NCS (NERVE CONDUCTION STUDY) WITH EMG (ELECTROMYOGRAPHY) REPORT   STUDY DATE: 10/24/14 PATIENT NAME: Cole Craig DOB: March 07, 1971 MRN: [id number]  ORDERING CLINICIAN: Andrey Spearman, MD   TECHNOLOGIST: Laretta Alstrom  ELECTROMYOGRAPHER: Earlean Polka. Ousman Dise, MD  CLINICAL INFORMATION: 43 year old male with lower extremity numbness.  FINDINGS: NERVE CONDUCTION STUDY: Bilateral peroneal and tibial motor responses and F wave latencies are normal. Bilateral H reflex responses are normal. Bilateral peroneal sensory responses are normal.  NEEDLE ELECTROMYOGRAPHY: Right lower extremity vastus medialis, tibialis anterior, gastrocnemius muscles are normal.  IMPRESSION:  This is a normal study. No electrodiagnostic evidence of large fiber neuropathy at this time.   INTERPRETING PHYSICIAN:  Penni Bombard, MD Certified in Neurology, Neurophysiology and Neuroimaging  [facility] [address] [place], [place] [address] [phone]

## 2014-11-15 ENCOUNTER — Other Ambulatory Visit: Payer: Self-pay | Admitting: Endocrinology

## 2015-01-13 ENCOUNTER — Other Ambulatory Visit: Payer: Self-pay | Admitting: Endocrinology

## 2015-01-29 ENCOUNTER — Other Ambulatory Visit (INDEPENDENT_AMBULATORY_CARE_PROVIDER_SITE_OTHER): Payer: BLUE CROSS/BLUE SHIELD

## 2015-01-29 DIAGNOSIS — E1140 Type 2 diabetes mellitus with diabetic neuropathy, unspecified: Secondary | ICD-10-CM

## 2015-01-29 LAB — BASIC METABOLIC PANEL
BUN: 15 mg/dL (ref 6–23)
CALCIUM: 9.3 mg/dL (ref 8.4–10.5)
CO2: 28 mEq/L (ref 19–32)
CREATININE: 0.74 mg/dL (ref 0.40–1.50)
Chloride: 103 mEq/L (ref 96–112)
GFR: 122.39 mL/min (ref >60.00)
Glucose, Bld: 111 mg/dL — ABNORMAL HIGH (ref 70–99)
Potassium: 3.9 mEq/L (ref 3.5–5.1)
SODIUM: 137 meq/L (ref 135–145)

## 2015-01-29 LAB — HEMOGLOBIN A1C: HEMOGLOBIN A1C: 5.9 % (ref 4.6–6.5)

## 2015-02-01 ENCOUNTER — Ambulatory Visit (INDEPENDENT_AMBULATORY_CARE_PROVIDER_SITE_OTHER): Payer: BLUE CROSS/BLUE SHIELD | Admitting: Endocrinology

## 2015-02-01 ENCOUNTER — Encounter: Payer: Self-pay | Admitting: Endocrinology

## 2015-02-01 ENCOUNTER — Other Ambulatory Visit: Payer: Self-pay | Admitting: *Deleted

## 2015-02-01 VITALS — BP 126/66 | HR 72 | Temp 98.0°F | Resp 14 | Ht 66.0 in | Wt 144.0 lb

## 2015-02-01 DIAGNOSIS — E1140 Type 2 diabetes mellitus with diabetic neuropathy, unspecified: Secondary | ICD-10-CM

## 2015-02-01 MED ORDER — METFORMIN HCL ER 750 MG PO TB24: 1500.0000 mg | ORAL_TABLET | Freq: Every day | ORAL | Status: DC

## 2015-02-01 MED ORDER — PREGABALIN 75 MG PO CAPS: 75.0000 mg | ORAL_CAPSULE | Freq: Two times a day (BID) | ORAL | Status: DC

## 2015-02-01 MED ORDER — PIOGLITAZONE HCL 30 MG PO TABS: 30.0000 mg | ORAL_TABLET | Freq: Every day | ORAL | Status: DC

## 2015-02-01 MED ORDER — PANTOPRAZOLE SODIUM 40 MG PO TBEC: 40.0000 mg | DELAYED_RELEASE_TABLET | Freq: Every day | ORAL | Status: DC

## 2015-02-01 MED ORDER — ATORVASTATIN CALCIUM 40 MG PO TABS: 40.0000 mg | ORAL_TABLET | Freq: Every day | ORAL | Status: DC

## 2015-02-01 NOTE — Progress Notes (Signed)
Patient ID: Cole Craig, male   DOB: November 12, 1971, 44 y.o.   MRN: [id number]    Reason for Appointment: Diabetes follow-up   History of Present Illness   Diagnosis: Type 2 DIABETES MELITUS, date of diagnosis: 2012       He has had relatively mild diabetes which has been generally well controlled with low-dose metformin and subsequently Actos added for optimal control He generally has mostly fasting hyperglycemia but also has not been checking his blood sugars after meals on most of his visits as directed  Recent history: He generally checks fasting blood sugars only despite reminders to take some readings after meals also His A1c again in normal range This is despite his fasting blood sugars averaging about 128.  Appears to have relatively higher readings at times when checking early in the morning compared to around 8-9 AM He is tolerating his regimen of metformin and Actos consistently without side effects His weight has gone down slightly even though he has not been able to exercise recently He says that he has been excessively busy with his work and not able to find time to exercise, relatively active at work however Usually following a healthy diet  Oral hypoglycemic drugs: Metformin ER 1500 mg and Actos 30 mg       Side effects from medications: None       Monitors blood glucose: Once a day.    Glucometer: One Touch.          Blood Glucose readings from meter download:  BEFORE breakfast: 99-156 with median 130   .          Meals: 3 meals per day.          Physical activity: exercise: None recently, previously was walking 20-25 min, 3/7 days a week           Complications: are: Neuropathy       Wt Readings from Last 3 Encounters:  02/01/15 144 lb (65.318 kg)  10/12/14 148 lb (67.132 kg)  09/28/14 148 lb 6.4 oz (67.314 kg)   Lab Results  Component Value Date   HGBA1C 5.9 01/29/2015   HGBA1C 5.6 09/28/2014   HGBA1C 6.0 08/30/2013   Lab Results  Component Value Date    MICROALBUR 0.2 09/28/2014   LDLCALC 78 09/28/2014   CREATININE 0.74 01/29/2015    Appointment on 01/29/2015  Component Date Value Ref Range Status  . Hgb A1c MFr Bld 01/29/2015 5.9  4.6 - 6.5 % Final   Glycemic Control Guidelines for People with Diabetes:Non Diabetic:  <6%Goal of Therapy: <7%Additional Action Suggested:  >8%   . Sodium 01/29/2015 137  135 - 145 mEq/L Final  . Potassium 01/29/2015 3.9  3.5 - 5.1 mEq/L Final  . Chloride 01/29/2015 103  96 - 112 mEq/L Final  . CO2 01/29/2015 28  19 - 32 mEq/L Final  . Glucose, Bld 01/29/2015 111* 70 - 99 mg/dL Final  . BUN 01/29/2015 15  6 - 23 mg/dL Final  . Creatinine, Ser 01/29/2015 0.74  0.40 - 1.50 mg/dL Final  . Calcium 01/29/2015 9.3  8.4 - 10.5 mg/dL Final  . GFR 01/29/2015 122.39  >60.00 mL/min Final      Medication List       This list is accurate as of: 02/01/15 10:38 AM.  Always use your most recent med list.               atorvastatin 40 MG tablet  Commonly known as:  LIPITOR  Take 1 tablet (40 mg total) by mouth daily.     glucose blood test strip  Commonly known as:  ONE TOUCH ULTRA TEST  Use as instructed to check blood sugars 3 times per day     Melatonin 10 MG Caps  Take by mouth.     metFORMIN 750 MG 24 hr tablet  Commonly known as:  GLUCOPHAGE-XR  Take 2 tablets (1,500 mg total) by mouth daily with breakfast.     ONE TOUCH ULTRA 2 W/DEVICE Kit  Use to check blood sugar 3 times per day dx code 250.00     pantoprazole 40 MG tablet  Commonly known as:  PROTONIX  Take 1 tablet (40 mg total) by mouth daily.     pioglitazone 30 MG tablet  Commonly known as:  ACTOS  Take 1 tablet (30 mg total) by mouth daily.     pregabalin 75 MG capsule  Commonly known as:  LYRICA  Take 1 capsule (75 mg total) by mouth 2 (two) times daily.     Vitamin D3 2000 UNITS Tabs  Take 2,000 Units by mouth 2 (two) times daily.     ZENPEP 20000 UNITS Cpep  Generic drug:  Pancrelipase (Lip-Prot-Amyl)  Take 25,000 Units by  mouth.        Allergies:  Allergies  Allergen Reactions  . Iohexol      Code: RASH, Desc: RASH AND TONGUE SWELLING   . Ivp Dye [Iodinated Diagnostic Agents]     Past Medical History  Diagnosis Date  . Diabetes mellitus   . Pancreatitis   . Pancreatic cystadenoma   . Hyperlipidemia   . GERD (gastroesophageal reflux disease)     Past Surgical History  Procedure Laterality Date  . Pancreatitis surgery       circa 2003 in [place], [place]  . Hernia repair    . Cholecystectomy      circa 2003 in [place], [place]    Family History  Problem Relation Age of Onset  . Arthritis Sister     Social History:  reports that he quit smoking about 3 years ago. His smoking use included Cigars. He has never used smokeless tobacco. He reports that he drinks about 0.6 oz of alcohol per week. He reports that he does not use illicit drugs.  Review of Systems:  Asking about headaches which may be on either side  No history of hypertension  Has history of pancreatitis and reflux, no  symptoms of abdominal pain recently, is taking pancreatic enzymes and followed by gastroenterologist  HYPERLIPIDEMIA: The lipid abnormality consists of elevated LDL treated with Lipitor  Lab Results  Component Value Date   CHOL 139 09/28/2014   HDL 37.60* 09/28/2014   LDLCALC 78 09/28/2014   TRIG 117.0 09/28/2014   CHOLHDL 4 09/28/2014    NEUROPATHY:  He  has had symptoms of burning and sharp pains in his legs since about 2012, probably about the time of his diabetes diagnosis No symptoms of numbness His symptoms are usually only present at night He has been taking Lyrica 150 mg at bedtime with relief, previously not benefiting from gabapentin Also was given a generic form of Metanx on the last visit and he did not try this   Diabetic foot exam in 10/15 shows normal monofilament sensation in the toes and plantar surfaces, no skin lesions or ulcers on the feet and normal pedal pulses     Examination:   BP 126/66 mmHg  Pulse 72  Temp(Src)  98 F (36.7 C)  Resp 14  Ht '5\' 6"'  (1.676 m)  Wt 144 lb (65.318 kg)  BMI 23.25 kg/m2  SpO2 96%  Body mass index is 23.25 kg/(m^2).    ASSESSMENT/ PLAN:   Diabetes type 2, not obese   The patient's diabetes control appears to be overall fairly good with normal A1c even though he has some fasting hyperglycemia. Not able to assess readings after meals as he forgets to check these He is tolerating Actos 30 mg and 1500 mg of metformin ER daily and control is stable Discussed again the need to check blood sugars about 2 hours after at least his evening meal on weekdays and other meals on weekends  NEUROPATHY: Not clear if this is all diabetes related since the onset was at the time of diagnosis or before He will continue Lyrica 150 mg at bedtime which he is tolerating and benefiting with pain relief  He will discuss his symptoms of headaches with PCP   Nancyjo Givhan 02/01/2015, 10:38 AM    No visits with results within 1 Day(s) from this visit. Latest known visit with results is:  Appointment on 01/29/2015  Component Date Value Ref Range Status  . Hgb A1c MFr Bld 01/29/2015 5.9  4.6 - 6.5 % Final   Glycemic Control Guidelines for People with Diabetes:Non Diabetic:  <6%Goal of Therapy: <7%Additional Action Suggested:  >8%   . Sodium 01/29/2015 137  135 - 145 mEq/L Final  . Potassium 01/29/2015 3.9  3.5 - 5.1 mEq/L Final  . Chloride 01/29/2015 103  96 - 112 mEq/L Final  . CO2 01/29/2015 28  19 - 32 mEq/L Final  . Glucose, Bld 01/29/2015 111* 70 - 99 mg/dL Final  . BUN 01/29/2015 15  6 - 23 mg/dL Final  . Creatinine, Ser 01/29/2015 0.74  0.40 - 1.50 mg/dL Final  . Calcium 01/29/2015 9.3  8.4 - 10.5 mg/dL Final  . GFR 01/29/2015 122.39  >60.00 mL/min Final

## 2015-02-01 NOTE — Patient Instructions (Signed)
Please check blood sugars at least half the time about 2 hours after any meal and 3 times per week on waking up.  Please bring blood sugar monitor to each visit.  Recommended blood sugar levels about 2 hours after meal is 140-180 and on waking up 90-130   

## 2015-03-30 ENCOUNTER — Other Ambulatory Visit: Payer: Self-pay | Admitting: Endocrinology

## 2015-04-26 ENCOUNTER — Other Ambulatory Visit (INDEPENDENT_AMBULATORY_CARE_PROVIDER_SITE_OTHER): Payer: BLUE CROSS/BLUE SHIELD

## 2015-04-26 DIAGNOSIS — E1140 Type 2 diabetes mellitus with diabetic neuropathy, unspecified: Secondary | ICD-10-CM

## 2015-04-26 LAB — COMPREHENSIVE METABOLIC PANEL
ALT: 24 U/L (ref 0–53)
AST: 22 U/L (ref 0–37)
Albumin: 4.2 g/dL (ref 3.5–5.2)
Alkaline Phosphatase: 80 U/L (ref 39–117)
BUN: 20 mg/dL (ref 6–23)
CHLORIDE: 103 meq/L (ref 96–112)
CO2: 25 mEq/L (ref 19–32)
Calcium: 9.9 mg/dL (ref 8.4–10.5)
Creatinine, Ser: 0.71 mg/dL (ref 0.40–1.50)
GFR: 128.23 mL/min (ref >60.00)
GLUCOSE: 134 mg/dL — AB (ref 70–99)
Potassium: 4.3 mEq/L (ref 3.5–5.1)
Sodium: 135 mEq/L (ref 135–145)
Total Bilirubin: 0.8 mg/dL (ref 0.2–1.2)
Total Protein: 6.7 g/dL (ref 6.0–8.3)

## 2015-04-26 LAB — HEMOGLOBIN A1C: Hgb A1c MFr Bld: 5.4 % (ref 4.6–6.5)

## 2015-05-02 ENCOUNTER — Other Ambulatory Visit: Payer: Self-pay | Admitting: *Deleted

## 2015-05-02 ENCOUNTER — Ambulatory Visit (INDEPENDENT_AMBULATORY_CARE_PROVIDER_SITE_OTHER): Payer: BLUE CROSS/BLUE SHIELD | Admitting: Endocrinology

## 2015-05-02 ENCOUNTER — Encounter: Payer: Self-pay | Admitting: Endocrinology

## 2015-05-02 VITALS — BP 118/74 | HR 70 | Temp 97.8°F | Resp 14 | Ht 66.0 in | Wt 147.2 lb

## 2015-05-02 DIAGNOSIS — E1140 Type 2 diabetes mellitus with diabetic neuropathy, unspecified: Secondary | ICD-10-CM | POA: Diagnosis not present

## 2015-05-02 DIAGNOSIS — E119 Type 2 diabetes mellitus without complications: Secondary | ICD-10-CM

## 2015-05-02 MED ORDER — L-METHYLFOLATE-B6-B12 3-35-2 MG PO TABS: 1.0000 | ORAL_TABLET | Freq: Every day | ORAL | Status: DC

## 2015-05-02 MED ORDER — PREGABALIN 75 MG PO CAPS: ORAL_CAPSULE | ORAL | Status: DC

## 2015-05-02 NOTE — Progress Notes (Signed)
Patient ID: Cole Craig, male   DOB: March 18, 1971, 44 y.o.   MRN: [id number]    Reason for Appointment: Diabetes follow-up   History of Present Illness   Diagnosis: Type 2 DIABETES MELITUS, date of diagnosis: 2012       He has had relatively mild diabetes which has been generally well controlled with low-dose metformin and subsequently Actos added for optimal control He generally has mostly fasting hyperglycemia but also has not been checking his blood sugars after meals on most of his visits as directed  Recent history: He is tolerating his regimen of metformin and Actos consistently without side effects Overall control is excellent as judged by an normal A1c although this probably is lower than expected for his home blood sugars He still checks fasting blood sugars only despite reminders to take some readings after meals also His blood sugars in the mornings are not below 100 and only rarely above 140 His weight has gone up slightly even though he has been exercising Usually following a healthy diet but occasionally will go off the diet and blood sugar may be higher the next morning  Oral hypoglycemic drugs: Metformin ER 1500 mg and Actos 30 mg       Side effects from medications: None       Monitors blood glucose: Once a day.    Glucometer: One Touch.          Blood Glucose readings from meter download:  BEFORE breakfast: 114-161 with median 127        Physical activity: exercise: walking 20-25 min, 3/7 days a week           Complications: are: Neuropathy       Wt Readings from Last 3 Encounters:  05/02/15 147 lb 3.2 oz (66.769 kg)  02/01/15 144 lb (65.318 kg)  10/12/14 148 lb (67.132 kg)   Lab Results  Component Value Date   HGBA1C 5.4 04/26/2015   HGBA1C 5.9 01/29/2015   HGBA1C 5.6 09/28/2014   Lab Results  Component Value Date   MICROALBUR 0.2 09/28/2014   LDLCALC 78 09/28/2014   CREATININE 0.71 04/26/2015    Lab on 04/26/2015  Component Date Value Ref Range  Status  . Hgb A1c MFr Bld 04/26/2015 5.4  4.6 - 6.5 % Final   Glycemic Control Guidelines for People with Diabetes:Non Diabetic:  <6%Goal of Therapy: <7%Additional Action Suggested:  >8%   . Sodium 04/26/2015 135  135 - 145 mEq/L Final  . Potassium 04/26/2015 4.3  3.5 - 5.1 mEq/L Final  . Chloride 04/26/2015 103  96 - 112 mEq/L Final  . CO2 04/26/2015 25  19 - 32 mEq/L Final  . Glucose, Bld 04/26/2015 134* 70 - 99 mg/dL Final  . BUN 04/26/2015 20  6 - 23 mg/dL Final  . Creatinine, Ser 04/26/2015 0.71  0.40 - 1.50 mg/dL Final  . Total Bilirubin 04/26/2015 0.8  0.2 - 1.2 mg/dL Final  . Alkaline Phosphatase 04/26/2015 80  39 - 117 U/L Final  . AST 04/26/2015 22  0 - 37 U/L Final  . ALT 04/26/2015 24  0 - 53 U/L Final  . Total Protein 04/26/2015 6.7  6.0 - 8.3 g/dL Final  . Albumin 04/26/2015 4.2  3.5 - 5.2 g/dL Final  . Calcium 04/26/2015 9.9  8.4 - 10.5 mg/dL Final  . GFR 04/26/2015 128.23  >60.00 mL/min Final      Medication List       This list is accurate as of:  05/02/15  9:04 AM.  Always use your most recent med list.               atorvastatin 40 MG tablet  Commonly known as:  LIPITOR  Take 1 tablet (40 mg total) by mouth daily.     glucose blood test strip  Commonly known as:  ONE TOUCH ULTRA TEST  Use as instructed to check blood sugars 3 times per day     Melatonin 10 MG Caps  Take by mouth.     metFORMIN 750 MG 24 hr tablet  Commonly known as:  GLUCOPHAGE-XR  Take 2 tablets (1,500 mg total) by mouth daily with breakfast.     ONE TOUCH ULTRA 2 W/DEVICE Kit  Use to check blood sugar 3 times per day dx code 250.00     pantoprazole 40 MG tablet  Commonly known as:  PROTONIX  Take 1 tablet (40 mg total) by mouth daily.     pioglitazone 30 MG tablet  Commonly known as:  ACTOS  Take 1 tablet (30 mg total) by mouth daily.     pregabalin 75 MG capsule  Commonly known as:  LYRICA  Take 1 capsule (75 mg total) by mouth 2 (two) times daily.     Vitamin D3 2000  UNITS Tabs  Take 2,000 Units by mouth 2 (two) times daily.     ZENPEP 20000 UNITS Cpep  Generic drug:  Pancrelipase (Lip-Prot-Amyl)  Take 25,000 Units by mouth.        Allergies:  Allergies  Allergen Reactions  . Iohexol      Code: RASH, Desc: RASH AND TONGUE SWELLING   . Ivp Dye [Iodinated Diagnostic Agents]     Past Medical History  Diagnosis Date  . Diabetes mellitus   . Pancreatitis   . Pancreatic cystadenoma   . Hyperlipidemia   . GERD (gastroesophageal reflux disease)     Past Surgical History  Procedure Laterality Date  . Pancreatitis surgery       circa 2003 in [place], [place]  . Hernia repair    . Cholecystectomy      circa 2003 in [place], [place]    Family History  Problem Relation Age of Onset  . Arthritis Sister   . Diabetes Mother   . Diabetes Brother     Social History:  reports that he quit smoking about 3 years ago. His smoking use included Cigars. He has never used smokeless tobacco. He reports that he drinks about 0.6 oz of alcohol per week. He reports that he does not use illicit drugs.  Review of Systems:  Asking about headaches, also has deviated nasal septum  No history of hypertension  Has history of pancreatitis and reflux, no  symptoms of abdominal pain recently, is taking pancreatic enzymes and followed by gastroenterologist  Has previous history of fatty liver with mildly abnormal liver function tests and abnormal radiological studies also as far back as 2009  HYPERLIPIDEMIA: The lipid abnormality consists of elevated LDL treated with Lipitor  Lab Results  Component Value Date   CHOL 139 09/28/2014   HDL 37.60* 09/28/2014   LDLCALC 78 09/28/2014   TRIG 117.0 09/28/2014   CHOLHDL 4 09/28/2014    NEUROPATHY:  He  has had symptoms of burning and sharp pains in his legs since about 2012, probably about the time of his diabetes diagnosis No symptoms of numbness although rarely may have some transient numbness in his  fingertips His symptoms are usually only present  at night interfering with sleep He has been taking Lyrica 150 mg at bedtime without any side effects However he thinks that his symptoms are not well-controlled recently Has previously tried gabapentin without relief Also was given a generic form of Metanx on the last visit and he did not try this   Diabetic foot exam in 10/15 shows normal monofilament sensation in the toes and plantar surfaces, no skin lesions or ulcers on the feet and normal pedal pulses    Examination:   BP 118/74 mmHg  Pulse 70  Temp(Src) 97.8 F (36.6 C)  Resp 14  Ht _0  (1.676 m)  Wt 147 lb 3.2 oz (66.769 kg)  BMI 23.77 kg/m2  SpO2 97%  Body mass index is 23.77 kg/(m^2).    ASSESSMENT/ PLAN:   Diabetes type 2, not obese   The patient's diabetes control appears to be overall fairly good with normal A1c even though he has some fasting hyperglycemia. Not able to assess readings after meals as he forgets to check these He is tolerating Actos 30 mg and 1500 mg of metformin ER daily and control is stable Discussed again the need to check blood sugars about 2 hours after at least his evening meal on weekdays and other meals on weekends  NEUROPATHY: Not clear if this is all diabetes related since the onset was at the time of diagnosis or before. He will try increasing the dose to 225 mg If not better will try Cymbalta Also  to try Metanx with coupon given for at least 30 days   Ranyia Witting 05/02/2015, 9:04 AM    No visits with results within 1 Day(s) from this visit. Latest known visit with results is:  Lab on 04/26/2015  Component Date Value Ref Range Status  . Hgb A1c MFr Bld 04/26/2015 5.4  4.6 - 6.5 % Final   Glycemic Control Guidelines for People with Diabetes:Non Diabetic:  <6%Goal of Therapy: <7%Additional Action Suggested:  >8%   . Sodium 04/26/2015 135  135 - 145 mEq/L Final  . Potassium 04/26/2015 4.3  3.5 - 5.1 mEq/L Final  . Chloride  04/26/2015 103  96 - 112 mEq/L Final  . CO2 04/26/2015 25  19 - 32 mEq/L Final  . Glucose, Bld 04/26/2015 134* 70 - 99 mg/dL Final  . BUN 04/26/2015 20  6 - 23 mg/dL Final  . Creatinine, Ser 04/26/2015 0.71  0.40 - 1.50 mg/dL Final  . Total Bilirubin 04/26/2015 0.8  0.2 - 1.2 mg/dL Final  . Alkaline Phosphatase 04/26/2015 80  39 - 117 U/L Final  . AST 04/26/2015 22  0 - 37 U/L Final  . ALT 04/26/2015 24  0 - 53 U/L Final  . Total Protein 04/26/2015 6.7  6.0 - 8.3 g/dL Final  . Albumin 04/26/2015 4.2  3.5 - 5.2 g/dL Final  . Calcium 04/26/2015 9.9  8.4 - 10.5 mg/dL Final  . GFR 04/26/2015 128.23  >60.00 mL/min Final

## 2015-05-02 NOTE — Patient Instructions (Signed)
Take 3 Lyrica otherwise will try Cymbalta  Please check blood sugars at least half the time about 2 hours after any meal and 3 times per week on waking up.  Please bring blood sugar monitor to each visit. Recommended blood sugar levels about 2 hours after meal is 140-180 and on waking up 90-130  Continue exercise

## 2015-08-14 ENCOUNTER — Other Ambulatory Visit: Payer: Self-pay | Admitting: *Deleted

## 2015-08-14 MED ORDER — PANTOPRAZOLE SODIUM 40 MG PO TBEC: 40.0000 mg | DELAYED_RELEASE_TABLET | Freq: Every day | ORAL | Status: DC

## 2015-08-15 ENCOUNTER — Other Ambulatory Visit: Payer: Self-pay | Admitting: *Deleted

## 2015-08-15 MED ORDER — PANTOPRAZOLE SODIUM 40 MG PO TBEC: 40.0000 mg | DELAYED_RELEASE_TABLET | Freq: Every day | ORAL | Status: DC

## 2015-08-21 ENCOUNTER — Other Ambulatory Visit: Payer: Self-pay | Admitting: *Deleted

## 2015-08-21 MED ORDER — GLUCOSE BLOOD VI STRP: ORAL_STRIP | Status: DC

## 2015-08-27 ENCOUNTER — Other Ambulatory Visit: Payer: Self-pay | Admitting: *Deleted

## 2015-08-27 MED ORDER — METFORMIN HCL ER 750 MG PO TB24: 1500.0000 mg | ORAL_TABLET | Freq: Every day | ORAL | Status: DC

## 2015-09-03 ENCOUNTER — Other Ambulatory Visit: Payer: BLUE CROSS/BLUE SHIELD

## 2015-09-06 ENCOUNTER — Ambulatory Visit: Payer: BLUE CROSS/BLUE SHIELD | Admitting: Endocrinology

## 2015-09-27 ENCOUNTER — Other Ambulatory Visit (INDEPENDENT_AMBULATORY_CARE_PROVIDER_SITE_OTHER): Payer: BLUE CROSS/BLUE SHIELD

## 2015-09-27 DIAGNOSIS — E119 Type 2 diabetes mellitus without complications: Secondary | ICD-10-CM

## 2015-09-27 LAB — BASIC METABOLIC PANEL
BUN: 22 mg/dL (ref 6–23)
CALCIUM: 9.4 mg/dL (ref 8.4–10.5)
CO2: 28 mEq/L (ref 19–32)
Chloride: 105 mEq/L (ref 96–112)
Creatinine, Ser: 0.79 mg/dL (ref 0.40–1.50)
GFR: 113.15 mL/min (ref >60.00)
Glucose, Bld: 120 mg/dL — ABNORMAL HIGH (ref 70–99)
Potassium: 4.1 mEq/L (ref 3.5–5.1)
SODIUM: 140 meq/L (ref 135–145)

## 2015-09-27 LAB — HEMOGLOBIN A1C: Hgb A1c MFr Bld: 5.5 % (ref 4.6–6.5)

## 2015-10-01 ENCOUNTER — Encounter: Payer: Self-pay | Admitting: Endocrinology

## 2015-10-01 ENCOUNTER — Ambulatory Visit (INDEPENDENT_AMBULATORY_CARE_PROVIDER_SITE_OTHER): Payer: BLUE CROSS/BLUE SHIELD | Admitting: Endocrinology

## 2015-10-01 VITALS — BP 114/72 | HR 75 | Temp 98.5°F | Resp 14 | Ht 66.0 in | Wt 146.4 lb

## 2015-10-01 DIAGNOSIS — E785 Hyperlipidemia, unspecified: Secondary | ICD-10-CM | POA: Diagnosis not present

## 2015-10-01 DIAGNOSIS — E1140 Type 2 diabetes mellitus with diabetic neuropathy, unspecified: Secondary | ICD-10-CM | POA: Diagnosis not present

## 2015-10-01 DIAGNOSIS — M792 Neuralgia and neuritis, unspecified: Secondary | ICD-10-CM | POA: Diagnosis not present

## 2015-10-01 MED ORDER — DULOXETINE HCL 30 MG PO CPEP: 30.0000 mg | ORAL_CAPSULE | Freq: Every day | ORAL | Status: DC

## 2015-10-01 NOTE — Progress Notes (Signed)
Patient ID: Cole Craig, male   DOB: 01/21/1971, 44 y.o.   MRN: [id number]    Reason for Appointment: Diabetes follow-up   History of Present Illness   Diagnosis: Type 2 DIABETES MELITUS, date of diagnosis: 2012       He has had relatively mild diabetes which has been generally well controlled with low-dose metformin and subsequently Actos added for optimal control He generally has mostly fasting hyperglycemia but also has not been checking his blood sugars after meals on most of his visits as directed  Recent history: He still has excellent control with his regimen of metformin and Actos without side effects Overall control is excellent as judged by an normal A1c although this appears lower than expected for his home blood sugars He still checks fasting blood sugars only despite reminders to take some readings after meals also Blood sugars are slightly better compared to his last visit His weight has leveled off and he is trying to exercise more regularly now Usually following a healthy diet but occasionally will go off the diet   Oral hypoglycemic drugs: Metformin ER 1500 mg and Actos 30 mg       Side effects from medications: None       Monitors blood glucose: Once a day.    Glucometer: One Touch.          Blood Glucose readings from meter download:  BEFORE breakfast: 94-143 with median 121        Physical activity: exercise: walking 20-25 min, 3/7 days a week           Complications: are: Neuropathy       Wt Readings from Last 3 Encounters:  10/01/15 146 lb 6.4 oz (66.407 kg)  05/02/15 147 lb 3.2 oz (66.769 kg)  02/01/15 144 lb (65.318 kg)   Lab Results  Component Value Date   HGBA1C 5.5 09/27/2015   HGBA1C 5.4 04/26/2015   HGBA1C 5.9 01/29/2015   Lab Results  Component Value Date   MICROALBUR 0.2 09/28/2014   LDLCALC 78 09/28/2014   CREATININE 0.79 09/27/2015    Appointment on 09/27/2015  Component Date Value Ref Range Status  . Hgb A1c MFr Bld 09/27/2015 5.5   4.6 - 6.5 % Final   Glycemic Control Guidelines for People with Diabetes:Non Diabetic:  <6%Goal of Therapy: <7%Additional Action Suggested:  >8%   . Sodium 09/27/2015 140  135 - 145 mEq/L Final  . Potassium 09/27/2015 4.1  3.5 - 5.1 mEq/L Final  . Chloride 09/27/2015 105  96 - 112 mEq/L Final  . CO2 09/27/2015 28  19 - 32 mEq/L Final  . Glucose, Bld 09/27/2015 120* 70 - 99 mg/dL Final  . BUN 09/27/2015 22  6 - 23 mg/dL Final  . Creatinine, Ser 09/27/2015 0.79  0.40 - 1.50 mg/dL Final  . Calcium 09/27/2015 9.4  8.4 - 10.5 mg/dL Final  . GFR 09/27/2015 113.15  >60.00 mL/min Final      Medication List       This list is accurate as of: 10/01/15 10:07 AM.  Always use your most recent med list.               atorvastatin 40 MG tablet  Commonly known as:  LIPITOR  Take 1 tablet (40 mg total) by mouth daily.     DULoxetine 30 MG capsule  Commonly known as:  CYMBALTA  Take 1 capsule (30 mg total) by mouth daily.     glucose blood test strip  Commonly known as:  ONE TOUCH ULTRA TEST  Use as instructed to check blood sugars 3 times per day Dx code E11.9     l-methylfolate-B6-B12 3-35-2 MG Tabs tablet  Commonly known as:  METANX  Take 1 tablet by mouth daily.     Melatonin 10 MG Caps  Take by mouth.     METANX 3-90.314-2-35 MG Caps  Take 1 capsule by mouth daily.     metFORMIN 750 MG 24 hr tablet  Commonly known as:  GLUCOPHAGE-XR  Take 2 tablets (1,500 mg total) by mouth daily with breakfast.     ONE TOUCH ULTRA 2 W/DEVICE Kit  Use to check blood sugar 3 times per day dx code 250.00     pantoprazole 40 MG tablet  Commonly known as:  PROTONIX  Take 1 tablet (40 mg total) by mouth daily.     pioglitazone 30 MG tablet  Commonly known as:  ACTOS  Take 1 tablet (30 mg total) by mouth daily.     pregabalin 75 MG capsule  Commonly known as:  LYRICA  Take 1 capsule 3 times a day     Vitamin D3 2000 UNITS Tabs  Take 2,000 Units by mouth 2 (two) times daily.     ZENPEP  20000 UNITS Cpep  Generic drug:  Pancrelipase (Lip-Prot-Amyl)  Take 25,000 Units by mouth.        Allergies:  Allergies  Allergen Reactions  . Iohexol      Code: RASH, Desc: RASH AND TONGUE SWELLING   . Ivp Dye [Iodinated Diagnostic Agents]     Past Medical History  Diagnosis Date  . Diabetes mellitus   . Pancreatitis   . Pancreatic cystadenoma   . Hyperlipidemia   . GERD (gastroesophageal reflux disease)     Past Surgical History  Procedure Laterality Date  . Pancreatitis surgery       circa 2003 in [place], [place]  . Hernia repair    . Cholecystectomy      circa 2003 in [place], [place]    Family History  Problem Relation Age of Onset  . Arthritis Sister   . Diabetes Mother   . Diabetes Brother     Social History:  reports that he quit smoking about 3 years ago. His smoking use included Cigars. He has never used smokeless tobacco. He reports that he drinks about 0.6 oz of alcohol per week. He reports that he does not use illicit drugs.  Review of Systems:  Asking about headaches again, also has deviated nasal septum  He is taking 10,000 units of vitamin D3 daily, not clear if this was prescribed by PCP.  No vitamin D levels available  Has history of pancreatitis and reflux, no  symptoms of abdominal pain recently, is taking pancreatic enzymes and followed by gastroenterologist  Has previous history of fatty liver with mildly abnormal liver function tests and abnormal radiological studies also as far back as 2009  HYPERLIPIDEMIA: The lipid abnormality consists of elevated LDL treated with Lipitor, last LDL from PCP was 70 in December 2015  Lab Results  Component Value Date   CHOL 139 09/28/2014   HDL 37.60* 09/28/2014   LDLCALC 78 09/28/2014   TRIG 117.0 09/28/2014   CHOLHDL 4 09/28/2014    NEUROPATHY:  He  has had symptoms of burning and sharp pains in his legs since about 2012, probably about the time of his diabetes diagnosis No symptoms of  numbness in his hands although rarely may have  some transient numbness in his fingertips His symptoms are usually only present at night interfering with sleep He has been taking Lyrica 150 mg at bedtime   However he thinks that his symptoms are not improved and still having difficulty with nocturnal symptoms on most nights Pains are down the whole leg and he has a altered sensation in his his feet but no numbness Has previously tried gabapentin without relief Also was given a generic form of Metanx on the last visit and he did not try this  He was seen by the neurologist about a year ago and no specific diagnosis made, surprisingly had a normal nerve conduction study   Diabetic foot exam in 10/16 shows normal monofilament sensation in the toes and plantar surfaces, no skin lesions or ulcers on the feet and normal pedal pulses    Examination:   BP 114/72 mmHg  Pulse 75  Temp(Src) 98.5 F (36.9 C)  Resp 14  Ht '5\' 6"'  (1.676 m)  Wt 146 lb 6.4 oz (66.407 kg)  BMI 23.64 kg/m2  SpO2 95%  Body mass index is 23.64 kg/(m^2).   Diabetic foot exam shows normal monofilament sensation in the toes and plantar surfaces, no skin lesions or ulcers on the feet and normal pedal pulses  No edema  ASSESSMENT/ PLAN:   Diabetes type 2 with BMI 23 and good control  The patient's diabetes control is excellent with again normal A1c Fasting blood sugars are mildly increased and not clear what his postprandial readings are since he does not monitor these readings   to be overall fairly good with normal A1c even though he has some fasting hyperglycemia. Not able to assess readings after meals as he forgets to check these He is taking Actos 30 mg and 1500 mg of metformin ER daily and control is stable Discussed again the need to check blood sugars about 2 hours after at least his evening meal on weekdays and other meals on weekends Discussed blood sugar targets, balanced meals Continue exercise  regimen  NEUROPATHY: He has significant symptoms and these are worse despite good control of his diabetes Not clear if this is all diabetes related since the onset was at the time of diagnosis or before. Since he is not getting relief with Lyrica will try Cymbalta starting with 30 mg and then going to 60 needed Recommended follow-up with neurologist for further evaluation Also  to leave off Metanx as he is not benefiting  He can also discuss his headaches with the neurologist  VITAMIN D: He will need to stop taking high doses and have his level checked by PCP  HYPERCHOLESTEROLEMIA: We will need follow-up on the next visit    Zeb Rawl 10/01/2015, 10:07 AM    No visits with results within 1 Day(s) from this visit. Latest known visit with results is:  Appointment on 09/27/2015  Component Date Value Ref Range Status  . Hgb A1c MFr Bld 09/27/2015 5.5  4.6 - 6.5 % Final   Glycemic Control Guidelines for People with Diabetes:Non Diabetic:  <6%Goal of Therapy: <7%Additional Action Suggested:  >8%   . Sodium 09/27/2015 140  135 - 145 mEq/L Final  . Potassium 09/27/2015 4.1  3.5 - 5.1 mEq/L Final  . Chloride 09/27/2015 105  96 - 112 mEq/L Final  . CO2 09/27/2015 28  19 - 32 mEq/L Final  . Glucose, Bld 09/27/2015 120* 70 - 99 mg/dL Final  . BUN 09/27/2015 22  6 - 23 mg/dL Final  . Creatinine,  Ser 09/27/2015 0.79  0.40 - 1.50 mg/dL Final  . Calcium 09/27/2015 9.4  8.4 - 10.5 mg/dL Final  . GFR 09/27/2015 113.15  >60.00 mL/min Final

## 2015-10-01 NOTE — Patient Instructions (Signed)
Check blood sugars on waking up .. 3 .. times a week Also check blood sugars about 2 hours after a meal and do this after different meals by rotation  Recommended blood sugar levels on waking up is 90-130 and about 2 hours after meal is 140-180 Please bring blood sugar monitor to each visit.  Cymbalta  Call [facility] Neurology if needed

## 2015-11-05 ENCOUNTER — Other Ambulatory Visit: Payer: Self-pay | Admitting: Endocrinology

## 2015-11-07 ENCOUNTER — Other Ambulatory Visit: Payer: Self-pay | Admitting: *Deleted

## 2015-11-07 MED ORDER — PREGABALIN 75 MG PO CAPS: ORAL_CAPSULE | ORAL | Status: DC

## 2015-11-10 ENCOUNTER — Other Ambulatory Visit: Payer: Self-pay | Admitting: Endocrinology

## 2015-11-26 ENCOUNTER — Telehealth: Payer: Self-pay | Admitting: Endocrinology

## 2015-11-26 NOTE — Telephone Encounter (Signed)
Patient was instructed to take 2 tablets daily with breakfast.

## 2015-11-26 NOTE — Telephone Encounter (Signed)
Pt needs to speak with Suanne Marker about metformin, how many is he supposed to take.  Pt also needs [place] to sign and insurance form from [facility]

## 2015-12-10 ENCOUNTER — Other Ambulatory Visit: Payer: Self-pay | Admitting: Endocrinology

## 2015-12-24 ENCOUNTER — Telehealth: Payer: Self-pay | Admitting: Endocrinology

## 2015-12-24 ENCOUNTER — Other Ambulatory Visit: Payer: Self-pay | Admitting: *Deleted

## 2015-12-24 MED ORDER — METFORMIN HCL ER 750 MG PO TB24: 1500.0000 mg | ORAL_TABLET | Freq: Every day | ORAL | Status: DC

## 2015-12-24 NOTE — Telephone Encounter (Signed)
rx sent

## 2015-12-24 NOTE — Telephone Encounter (Signed)
Patient need refill of metformin, send to  [facility], [place] [phone] (Phone) [phone] (Fax)

## 2016-01-24 LAB — HM DIABETES EYE EXAM

## 2016-01-27 ENCOUNTER — Telehealth: Payer: Self-pay | Admitting: Endocrinology

## 2016-01-27 NOTE — Telephone Encounter (Signed)
Please see below and advise.

## 2016-01-27 NOTE — Telephone Encounter (Signed)
Noted, patient is aware. 

## 2016-01-27 NOTE — Telephone Encounter (Signed)
Patient want to know if he has to fast for blood work

## 2016-01-27 NOTE — Telephone Encounter (Signed)
no

## 2016-01-28 ENCOUNTER — Other Ambulatory Visit (INDEPENDENT_AMBULATORY_CARE_PROVIDER_SITE_OTHER): Payer: BLUE CROSS/BLUE SHIELD

## 2016-01-28 DIAGNOSIS — E1140 Type 2 diabetes mellitus with diabetic neuropathy, unspecified: Secondary | ICD-10-CM | POA: Diagnosis not present

## 2016-01-28 LAB — LIPID PANEL
CHOLESTEROL: 131 mg/dL (ref 0–200)
HDL: 40.7 mg/dL (ref >39.00)
LDL Cholesterol: 55 mg/dL (ref 0–99)
NONHDL: 90.63
TRIGLYCERIDES: 179 mg/dL — AB (ref 0.0–149.0)
Total CHOL/HDL Ratio: 3
VLDL: 35.8 mg/dL (ref 0.0–40.0)

## 2016-01-28 LAB — COMPREHENSIVE METABOLIC PANEL
ALBUMIN: 4.4 g/dL (ref 3.5–5.2)
ALK PHOS: 80 U/L (ref 39–117)
ALT: 21 U/L (ref 0–53)
AST: 23 U/L (ref 0–37)
BUN: 16 mg/dL (ref 6–23)
CO2: 26 mEq/L (ref 19–32)
Calcium: 9.4 mg/dL (ref 8.4–10.5)
Chloride: 103 mEq/L (ref 96–112)
Creatinine, Ser: 0.87 mg/dL (ref 0.40–1.50)
GFR: 101.07 mL/min (ref >60.00)
GLUCOSE: 171 mg/dL — AB (ref 70–99)
POTASSIUM: 4.3 meq/L (ref 3.5–5.1)
Sodium: 138 mEq/L (ref 135–145)
TOTAL PROTEIN: 7.2 g/dL (ref 6.0–8.3)
Total Bilirubin: 0.7 mg/dL (ref 0.2–1.2)

## 2016-01-28 LAB — MICROALBUMIN / CREATININE URINE RATIO
Creatinine,U: 191.7 mg/dL
MICROALB UR: 1 mg/dL (ref 0.0–1.9)
MICROALB/CREAT RATIO: 0.5 mg/g (ref 0.0–30.0)

## 2016-01-28 LAB — HEMOGLOBIN A1C: Hgb A1c MFr Bld: 5.2 % (ref 4.6–6.5)

## 2016-01-31 ENCOUNTER — Ambulatory Visit (INDEPENDENT_AMBULATORY_CARE_PROVIDER_SITE_OTHER): Payer: BLUE CROSS/BLUE SHIELD | Admitting: Endocrinology

## 2016-01-31 ENCOUNTER — Encounter: Payer: Self-pay | Admitting: Endocrinology

## 2016-01-31 VITALS — BP 116/68 | HR 80 | Temp 98.9°F | Resp 14 | Ht 66.0 in | Wt 144.8 lb

## 2016-01-31 DIAGNOSIS — E1140 Type 2 diabetes mellitus with diabetic neuropathy, unspecified: Secondary | ICD-10-CM | POA: Diagnosis not present

## 2016-01-31 MED ORDER — DULOXETINE HCL 60 MG PO CPEP: 60.0000 mg | ORAL_CAPSULE | Freq: Every day | ORAL | Status: DC

## 2016-01-31 NOTE — Progress Notes (Signed)
Patient ID: Cole Craig, male   DOB: 1971-01-20, 45 y.o.   MRN: [id number]    Reason for Appointment: Diabetes follow-up   History of Present Illness   Diagnosis: Type 2 DIABETES MELITUS, date of diagnosis: 2012       He has had relatively mild diabetes which has been generally well controlled with low-dose metformin and subsequently Actos added for optimal control He generally has mostly fasting hyperglycemia but also has not been checking his blood sugars after meals on most of his visits as directed  Recent history: Has a relatively low A1c even though his blood sugars in the mornings are somewhat higher than before  He still checks fasting blood sugars only despite reminders to take some readings after meals also Blood sugars are slightly better compared to his last visit He has been exercising regularly except when he had ENT surgery  Usually following a healthy diet but occasionally will go off the diet   Oral hypoglycemic drugs: Metformin ER 1500 mg and Actos 30 mg       Side effects from medications: None       Monitors blood glucose: Once a day.    Glucometer: One Touch.          Blood Glucose readings from meter download:  BEFORE breakfast:  Recent range 123-156, median 136 previously median 121        Physical activity: exercise: walking at gym 20-25 min, 3/7 days a week           Complications: are: Neuropathy       Wt Readings from Last 3 Encounters:  01/31/16 144 lb 12.8 oz (65.681 kg)  10/01/15 146 lb 6.4 oz (66.407 kg)  05/02/15 147 lb 3.2 oz (66.769 kg)   Lab Results  Component Value Date   HGBA1C 5.2 01/28/2016   HGBA1C 5.5 09/27/2015   HGBA1C 5.4 04/26/2015   Lab Results  Component Value Date   MICROALBUR 1.0 01/28/2016   LDLCALC 55 01/28/2016   CREATININE 0.87 01/28/2016    Lab on 01/28/2016  Component Date Value Ref Range Status  . Hgb A1c MFr Bld 01/28/2016 5.2  4.6 - 6.5 % Final   Glycemic Control Guidelines for People with Diabetes:Non  Diabetic:  <6%Goal of Therapy: <7%Additional Action Suggested:  >8%   . Sodium 01/28/2016 138  135 - 145 mEq/L Final  . Potassium 01/28/2016 4.3  3.5 - 5.1 mEq/L Final  . Chloride 01/28/2016 103  96 - 112 mEq/L Final  . CO2 01/28/2016 26  19 - 32 mEq/L Final  . Glucose, Bld 01/28/2016 171* 70 - 99 mg/dL Final  . BUN 01/28/2016 16  6 - 23 mg/dL Final  . Creatinine, Ser 01/28/2016 0.87  0.40 - 1.50 mg/dL Final  . Total Bilirubin 01/28/2016 0.7  0.2 - 1.2 mg/dL Final  . Alkaline Phosphatase 01/28/2016 80  39 - 117 U/L Final  . AST 01/28/2016 23  0 - 37 U/L Final  . ALT 01/28/2016 21  0 - 53 U/L Final  . Total Protein 01/28/2016 7.2  6.0 - 8.3 g/dL Final  . Albumin 01/28/2016 4.4  3.5 - 5.2 g/dL Final  . Calcium 01/28/2016 9.4  8.4 - 10.5 mg/dL Final  . GFR 01/28/2016 101.07  >60.00 mL/min Final  . Microalb, Ur 01/28/2016 1.0  0.0 - 1.9 mg/dL Final  . Creatinine,U 01/28/2016 191.7   Final  . Microalb Creat Ratio 01/28/2016 0.5  0.0 - 30.0 mg/g Final  . Cholesterol 01/28/2016  131  0 - 200 mg/dL Final   ATP III Classification       Desirable:  < 200 mg/dL               Borderline High:  200 - 239 mg/dL          High:  > = 240 mg/dL  . Triglycerides 01/28/2016 179.0* 0.0 - 149.0 mg/dL Final   Normal:  <150 mg/dLBorderline High:  150 - 199 mg/dL  . HDL 01/28/2016 40.70  >39.00 mg/dL Final  . VLDL 01/28/2016 35.8  0.0 - 40.0 mg/dL Final  . LDL Cholesterol 01/28/2016 55  0 - 99 mg/dL Final  . Total CHOL/HDL Ratio 01/28/2016 3   Final                  Men          Women1/2 Average Risk     3.4          3.3Average Risk          5.0          4.42X Average Risk          9.6          7.13X Average Risk          15.0          11.0                      . NonHDL 01/28/2016 90.63   Final   NOTE:  Non-HDL goal should be 30 mg/dL higher than patient's LDL goal (i.e. LDL goal of < 70 mg/dL, would have non-HDL goal of < 100 mg/dL)      Medication List       This list is accurate as of: 01/31/16 11:26 AM.   Always use your most recent med list.               atorvastatin 40 MG tablet  Commonly known as:  LIPITOR  Take 1 tablet (40 mg total) by mouth daily.     atorvastatin 40 MG tablet  Commonly known as:  LIPITOR  TAKE 1 TABLET DAILY     DULoxetine 60 MG capsule  Commonly known as:  CYMBALTA  Take 1 capsule (60 mg total) by mouth daily.     glucose blood test strip  Commonly known as:  ONE TOUCH ULTRA TEST  Use as instructed to check blood sugars 3 times per day Dx code E11.9     Melatonin 10 MG Caps  Take by mouth.     metFORMIN 750 MG 24 hr tablet  Commonly known as:  GLUCOPHAGE-XR  Take 2 tablets (1,500 mg total) by mouth daily with breakfast.     ONE TOUCH ULTRA 2 w/Device Kit  Use to check blood sugar 3 times per day dx code 250.00     pioglitazone 30 MG tablet  Commonly known as:  ACTOS  Take 1 tablet (30 mg total) by mouth daily.     pioglitazone 30 MG tablet  Commonly known as:  ACTOS  TAKE 1 TABLET DAILY     pregabalin 75 MG capsule  Commonly known as:  LYRICA  Take 1 capsule 3 times a day     Vitamin D3 2000 units Tabs  Take 2,000 Units by mouth 2 (two) times daily.     ZENPEP 20000 units Cpep  Generic drug:  Pancrelipase (Lip-Prot-Amyl)  Take 25,000 Units by mouth.  Allergies:  Allergies  Allergen Reactions  . Iohexol      Code: RASH, Desc: RASH AND TONGUE SWELLING   . Ivp Dye [Iodinated Diagnostic Agents]     Past Medical History  Diagnosis Date  . Diabetes mellitus   . Pancreatitis   . Pancreatic cystadenoma   . Hyperlipidemia   . GERD (gastroesophageal reflux disease)     Past Surgical History  Procedure Laterality Date  . Pancreatitis surgery       circa 2003 in [place], [place]  . Hernia repair    . Cholecystectomy      circa 2003 in [place], [place]    Family History  Problem Relation Age of Onset  . Arthritis Sister   . Diabetes Mother   . Diabetes Brother     Social History:  reports that he quit smoking  about 4 years ago. His smoking use included Cigars. He has never used smokeless tobacco. He reports that he drinks about 0.6 oz of alcohol per week. He reports that he does not use illicit drugs.  Review of Systems:   He is taking 10,000 units of vitamin D3 daily, not clear if this was prescribed by PCP.  No vitamin D levels available  Has history of pancreatitis and reflux, no  symptoms of abdominal pain recently, is taking pancreatic enzymes and followed by gastroenterologist  Has previous history of fatty liver with mildly abnormal liver function tests and abnormal radiological studies also as far back as 2009  HYPERLIPIDEMIA: The lipid abnormality consists of elevated LDL treated with Lipitor   Lab Results  Component Value Date   CHOL 131 01/28/2016   HDL 40.70 01/28/2016   LDLCALC 55 01/28/2016   TRIG 179.0* 01/28/2016   CHOLHDL 3 01/28/2016    NEUROPATHY:  He  has had symptoms of burning and sharp pains in his legs since about 2012, probably about the time of his diabetes diagnosis No symptoms of numbness in his hands although rarely may have some transient numbness in his fingertips His symptoms are usually only present at night interfering with sleep He has been taking Lyrica 150 mg at bedtime  On his last visit he was started on Cymbalta 30 mg daily and he was told to increase it if needed However he has not had more than a slight improvement in his symptoms at night, no side effects so far He has difficulty sleeping because of the persistent burning and intermittent sharp jabs of pain  Has previously tried gabapentin without relief Also was given a generic form of Metanx but this did not help He was seen by the neurologist about a year ago and no specific diagnosis made, surprisingly had a normal nerve conduction study   Diabetic foot exam in 10/16 shows normal monofilament sensation in the toes and plantar surfaces, no skin lesions or ulcers on the feet and normal pedal  pulses    Examination:   BP 116/68 mmHg  Pulse 80  Temp(Src) 98.9 F (37.2 C)  Resp 14  Ht '5\' 6"'  (1.676 m)  Wt 144 lb 12.8 oz (65.681 kg)  BMI 23.38 kg/m2  SpO2 95%  Body mass index is 23.38 kg/(m^2).     ASSESSMENT/ PLAN:   Diabetes type 2 with BMI 23 and good control  The patient's diabetes control is excellent with again normal A1c Fasting blood sugars are mildly increased and he may not be having high readings after meals since A1c is good.  Nonfasting glucose after breakfast  was about 170 in the lab  He is taking Actos 30 mg and 1500 mg of metformin ER daily and control is stable Discussed again the need to check blood sugars about 2 hours after at least his evening meal on weekdays and other meals on weekends Consider adjusting his medications if he has post prandial hyperglycemia Continue exercise regimen  NEUROPATHY: He has significant symptoms and these are worse despite good control of his diabetes Not clear if this is all diabetes related since the onset was at the time of diagnosis or before. Since he is not getting relief with 30 mg Cymbalta he can go up to 60 mg and if need be 90 mg also Also  to leave off Metanx as he is not benefiting   VITAMIN D: He will need to reduce the dose to 5000 units Not clear if this has been monitored by PCP  HYPERCHOLESTEROLEMIA: Well controlled    Cole Craig 01/31/2016, 11:26 AM    No visits with results within 1 Day(s) from this visit. Latest known visit with results is:  Lab on 01/28/2016  Component Date Value Ref Range Status  . Hgb A1c MFr Bld 01/28/2016 5.2  4.6 - 6.5 % Final   Glycemic Control Guidelines for People with Diabetes:Non Diabetic:  <6%Goal of Therapy: <7%Additional Action Suggested:  >8%   . Sodium 01/28/2016 138  135 - 145 mEq/L Final  . Potassium 01/28/2016 4.3  3.5 - 5.1 mEq/L Final  . Chloride 01/28/2016 103  96 - 112 mEq/L Final  . CO2 01/28/2016 26  19 - 32 mEq/L Final  . Glucose, Bld  01/28/2016 171* 70 - 99 mg/dL Final  . BUN 01/28/2016 16  6 - 23 mg/dL Final  . Creatinine, Ser 01/28/2016 0.87  0.40 - 1.50 mg/dL Final  . Total Bilirubin 01/28/2016 0.7  0.2 - 1.2 mg/dL Final  . Alkaline Phosphatase 01/28/2016 80  39 - 117 U/L Final  . AST 01/28/2016 23  0 - 37 U/L Final  . ALT 01/28/2016 21  0 - 53 U/L Final  . Total Protein 01/28/2016 7.2  6.0 - 8.3 g/dL Final  . Albumin 01/28/2016 4.4  3.5 - 5.2 g/dL Final  . Calcium 01/28/2016 9.4  8.4 - 10.5 mg/dL Final  . GFR 01/28/2016 101.07  >60.00 mL/min Final  . Microalb, Ur 01/28/2016 1.0  0.0 - 1.9 mg/dL Final  . Creatinine,U 01/28/2016 191.7   Final  . Microalb Creat Ratio 01/28/2016 0.5  0.0 - 30.0 mg/g Final  . Cholesterol 01/28/2016 131  0 - 200 mg/dL Final   ATP III Classification       Desirable:  < 200 mg/dL               Borderline High:  200 - 239 mg/dL          High:  > = 240 mg/dL  . Triglycerides 01/28/2016 179.0* 0.0 - 149.0 mg/dL Final   Normal:  <150 mg/dLBorderline High:  150 - 199 mg/dL  . HDL 01/28/2016 40.70  >39.00 mg/dL Final  . VLDL 01/28/2016 35.8  0.0 - 40.0 mg/dL Final  . LDL Cholesterol 01/28/2016 55  0 - 99 mg/dL Final  . Total CHOL/HDL Ratio 01/28/2016 3   Final                  Men          Women1/2 Average Risk     3.4  3.3Average Risk          5.0          4.42X Average Risk          9.6          7.13X Average Risk          15.0          11.0                      . NonHDL 01/28/2016 90.63   Final   NOTE:  Non-HDL goal should be 30 mg/dL higher than patient's LDL goal (i.e. LDL goal of < 70 mg/dL, would have non-HDL goal of < 100 mg/dL)

## 2016-01-31 NOTE — Patient Instructions (Signed)
Duloxitene 60 mg for 1-2 weeks and if needed 90 mg  If 90 mg working cut Lyrica to 75  Check blood sugars on waking up   times a week Also check blood sugars about 2 hours after a meal and do this after different meals by rotation  Recommended blood sugar levels on waking up is 90-130 and about 2 hours after meal is 130-160  Please bring your blood sugar monitor to each visit, thank you

## 2016-02-03 ENCOUNTER — Other Ambulatory Visit: Payer: Self-pay | Admitting: Endocrinology

## 2016-02-12 ENCOUNTER — Telehealth: Payer: Self-pay | Admitting: Endocrinology

## 2016-02-12 ENCOUNTER — Other Ambulatory Visit: Payer: Self-pay | Admitting: *Deleted

## 2016-02-12 MED ORDER — PANTOPRAZOLE SODIUM 40 MG PO TBEC: 40.0000 mg | DELAYED_RELEASE_TABLET | Freq: Every day | ORAL | Status: DC

## 2016-02-12 NOTE — Telephone Encounter (Signed)
rx sent

## 2016-02-12 NOTE — Telephone Encounter (Signed)
Patient need a refill of Pantoprazole 40 mg send to  [id number] - [place], [place] - [address] [place] [address] [phone] (Phone) [phone] (Fax)

## 2016-02-16 ENCOUNTER — Other Ambulatory Visit: Payer: Self-pay | Admitting: Endocrinology

## 2016-02-25 ENCOUNTER — Other Ambulatory Visit: Payer: Self-pay | Admitting: *Deleted

## 2016-02-25 MED ORDER — DULOXETINE HCL 30 MG PO CPEP: 30.0000 mg | ORAL_CAPSULE | Freq: Every day | ORAL | Status: DC

## 2016-03-15 ENCOUNTER — Other Ambulatory Visit: Payer: Self-pay | Admitting: Endocrinology

## 2016-04-21 ENCOUNTER — Other Ambulatory Visit: Payer: Self-pay | Admitting: *Deleted

## 2016-04-21 ENCOUNTER — Telehealth: Payer: Self-pay | Admitting: Endocrinology

## 2016-04-21 MED ORDER — DULOXETINE HCL 60 MG PO CPEP: ORAL_CAPSULE | ORAL | Status: DC

## 2016-04-21 NOTE — Telephone Encounter (Signed)
Pt needs refill on med and needs to discuss the dosing

## 2016-04-21 NOTE — Telephone Encounter (Signed)
rx sent for Cymbalta 60 mg.

## 2016-04-28 ENCOUNTER — Other Ambulatory Visit: Payer: BLUE CROSS/BLUE SHIELD

## 2016-04-29 ENCOUNTER — Other Ambulatory Visit (INDEPENDENT_AMBULATORY_CARE_PROVIDER_SITE_OTHER): Payer: BLUE CROSS/BLUE SHIELD

## 2016-04-29 DIAGNOSIS — E1140 Type 2 diabetes mellitus with diabetic neuropathy, unspecified: Secondary | ICD-10-CM | POA: Diagnosis not present

## 2016-04-29 LAB — HEMOGLOBIN A1C: Hgb A1c MFr Bld: 5.6 % (ref 4.6–6.5)

## 2016-04-29 LAB — BASIC METABOLIC PANEL
BUN: 16 mg/dL (ref 6–23)
CHLORIDE: 102 meq/L (ref 96–112)
CO2: 28 mEq/L (ref 19–32)
Calcium: 9 mg/dL (ref 8.4–10.5)
Creatinine, Ser: 0.8 mg/dL (ref 0.40–1.50)
GFR: 111.22 mL/min (ref >60.00)
GLUCOSE: 138 mg/dL — AB (ref 70–99)
POTASSIUM: 4.5 meq/L (ref 3.5–5.1)
SODIUM: 136 meq/L (ref 135–145)

## 2016-05-01 ENCOUNTER — Ambulatory Visit (INDEPENDENT_AMBULATORY_CARE_PROVIDER_SITE_OTHER): Payer: BLUE CROSS/BLUE SHIELD | Admitting: Endocrinology

## 2016-05-01 ENCOUNTER — Encounter: Payer: Self-pay | Admitting: Endocrinology

## 2016-05-01 VITALS — BP 112/76 | HR 79 | Temp 98.7°F | Ht 66.0 in | Wt 144.0 lb

## 2016-05-01 DIAGNOSIS — E1140 Type 2 diabetes mellitus with diabetic neuropathy, unspecified: Secondary | ICD-10-CM

## 2016-05-01 NOTE — Progress Notes (Signed)
Patient ID: Cole Craig, male   DOB: Mar 14, 1971, 45 y.o.   MRN: [id number]    Reason for Appointment: Diabetes follow-up   History of Present Illness   Diagnosis: Type 2 DIABETES MELITUS, date of diagnosis: 2012       He has had relatively mild diabetes which has been generally well controlled with low-dose metformin and subsequently Actos added for optimal control He generally has mostly fasting hyperglycemia but also has not been checking his blood sugars after meals on most of his visits as directed  Recent history: Has a relatively low A1c even though his blood sugars in the mornings are averaging 144 Blood sugars appear to be progressively higher over the last 2 visits judging from his home readings, A1c is slightly higher at 5.6  He still checks fasting blood sugars only despite reminders to take some readings after meals especially after supper He thinks maybe his diet tends not is consistent recently, last night had passed out He has been exercising a couple of times a week and doing some walking No side effects from current regimen of metformin and Actos  Oral hypoglycemic drugs: Metformin ER 1500 mg and Actos 30 mg       Side effects from medications: None       Monitors blood glucose: Once a day.    Glucometer: One Touch.          Blood Glucose readings from meter download:  BEFORE breakfast:  Recent range 124-174 MEDIAN 143        Physical activity: exercise: walking at gym 20-25 min, 3/7 days a week           Complications: are: Neuropathy       Wt Readings from Last 3 Encounters:  05/01/16 144 lb (65.318 kg)  01/31/16 144 lb 12.8 oz (65.681 kg)  10/01/15 146 lb 6.4 oz (66.407 kg)   Lab Results  Component Value Date   HGBA1C 5.6 04/29/2016   HGBA1C 5.2 01/28/2016   HGBA1C 5.5 09/27/2015   Lab Results  Component Value Date   MICROALBUR 1.0 01/28/2016   LDLCALC 55 01/28/2016   CREATININE 0.80 04/29/2016    Lab on 04/29/2016  Component Date  Value Ref Range Status  . Hgb A1c MFr Bld 04/29/2016 5.6  4.6 - 6.5 % Final   Glycemic Control Guidelines for People with Diabetes:Non Diabetic:  <6%Goal of Therapy: <7%Additional Action Suggested:  >8%   . Sodium 04/29/2016 136  135 - 145 mEq/L Final  . Potassium 04/29/2016 4.5  3.5 - 5.1 mEq/L Final  . Chloride 04/29/2016 102  96 - 112 mEq/L Final  . CO2 04/29/2016 28  19 - 32 mEq/L Final  . Glucose, Bld 04/29/2016 138* 70 - 99 mg/dL Final  . BUN 04/29/2016 16  6 - 23 mg/dL Final  . Creatinine, Ser 04/29/2016 0.80  0.40 - 1.50 mg/dL Final  . Calcium 04/29/2016 9.0  8.4 - 10.5 mg/dL Final  . GFR 04/29/2016 111.22  >60.00 mL/min Final      Medication List       This list is accurate as of: 05/01/16 11:01 AM.  Always use your most recent med list.               atorvastatin 40 MG tablet  Commonly known as:  LIPITOR  TAKE 1 TABLET DAILY     DULoxetine 60 MG capsule  Commonly known as:  CYMBALTA  Take 1 capsule at bedtime daily  glucose blood test strip  Commonly known as:  ONE TOUCH ULTRA TEST  Use as instructed to check blood sugars 3 times per day Dx code E11.9     Melatonin 10 MG Caps  Take by mouth.     METANX 3-90.314-2-35 MG Caps  TAKE ONE CAPSULE BY MOUTH EVERY DAY     metFORMIN 750 MG 24 hr tablet  Commonly known as:  GLUCOPHAGE-XR  Take 2 tablets (1,500 mg total) by mouth daily with breakfast.     ONE TOUCH ULTRA 2 w/Device Kit  Use to check blood sugar 3 times per day dx code 250.00     pantoprazole 40 MG tablet  Commonly known as:  PROTONIX  TAKE 1 TABLET DAILY     pioglitazone 30 MG tablet  Commonly known as:  ACTOS  Take 1 tablet (30 mg total) by mouth daily.     Vitamin D3 2000 units Tabs  Take 2,000 Units by mouth 2 (two) times daily.     ZENPEP 20000 units Cpep  Generic drug:  Pancrelipase (Lip-Prot-Amyl)  Take 25,000 Units by mouth.        Allergies:  Allergies  Allergen Reactions  . Iohexol      Code: RASH, Desc: RASH AND TONGUE  SWELLING   . Ivp Dye [Iodinated Diagnostic Agents]     Past Medical History  Diagnosis Date  . Diabetes mellitus   . Pancreatitis   . Pancreatic cystadenoma   . Hyperlipidemia   . GERD (gastroesophageal reflux disease)     Past Surgical History  Procedure Laterality Date  . Pancreatitis surgery       circa 2003 in [place], [place]  . Hernia repair    . Cholecystectomy      circa 2003 in [place], [place]    Family History  Problem Relation Age of Onset  . Arthritis Sister   . Diabetes Mother   . Diabetes Brother     Social History:  reports that he quit smoking about 4 years ago. His smoking use included Cigars. He has never used smokeless tobacco. He reports that he drinks about 0.6 oz of alcohol per week. He reports that he does not use illicit drugs.  Review of Systems:   He is taking 5,000 units of vitamin D3 daily, not clear if this was prescribed by PCP.    Has Past history of pancreatitis and reflux  Has previous history of fatty liver with mildly abnormal liver function tests and abnormal radiological studies also as far back as 2009  Lab Results  Component Value Date   ALT 21 01/28/2016     HYPERLIPIDEMIA: The lipid abnormality consists of elevated LDL treated with Lipitor   Lab Results  Component Value Date   CHOL 131 01/28/2016   HDL 40.70 01/28/2016   LDLCALC 55 01/28/2016   TRIG 179.0* 01/28/2016   CHOLHDL 3 01/28/2016    NEUROPATHY:  He  has had symptoms of burning and sharp pains in his legs since about 2012, probably about the time of his diabetes diagnosis No symptoms of numbness in his hands although rarely may have some transient numbness in his fingertips His symptoms are usually only present at night interfering with sleep  Since he had not been getting relief with Neurontin and later, he has been on Cymbalta which was increased to 60 mg Since he increased the dose to 60 mg his symptoms of burning and sharp pains are better and occur  only a couple of  times a month now Taking the Cymbalta at bedtime He was seen by the neurologist about a year ago and no specific diagnosis made, surprisingly had a normal nerve conduction study   Diabetic foot exam in 10/16 shows normal monofilament sensation in the toes and plantar surfaces, no skin lesions or ulcers on the feet and normal pedal pulses    Examination:   BP 112/76 mmHg  Pulse 79  Temp(Src) 98.7 F (37.1 C) (Oral)  Ht _0  (1.676 m)  Wt 144 lb (65.318 kg)  BMI 23.25 kg/m2  SpO2 97%  Body mass index is 23.25 kg/(m^2).     ASSESSMENT/ PLAN:   Diabetes type 2 with BMI 23 and good control  The patient's diabetes control is appearing fairly good with normal A1c Fasting blood sugars are higher were relatively high and averaging 144, appeared to be gradually increasing over the last 2 visits Not knowing whether he has high readings after meals especially supper will for now increase his metformin to 2250 mg He will call in 2 weeks to report if he has better readings are not improved May consider acarbose if blood sugars are consistently high after supper Encouraged him to exercise regularly  NEUROPATHY: He has better relief of symptoms with Cymbalta 60 mg and will continue    Khaniyah Bezek 05/01/2016, 11:01 AM    No visits with results within 1 Day(s) from this visit. Latest known visit with results is:  Lab on 04/29/2016  Component Date Value Ref Range Status  . Hgb A1c MFr Bld 04/29/2016 5.6  4.6 - 6.5 % Final   Glycemic Control Guidelines for People with Diabetes:Non Diabetic:  <6%Goal of Therapy: <7%Additional Action Suggested:  >8%   . Sodium 04/29/2016 136  135 - 145 mEq/L Final  . Potassium 04/29/2016 4.5  3.5 - 5.1 mEq/L Final  . Chloride 04/29/2016 102  96 - 112 mEq/L Final  . CO2 04/29/2016 28  19 - 32 mEq/L Final  . Glucose, Bld 04/29/2016 138* 70 - 99 mg/dL Final  . BUN 04/29/2016 16  6 - 23 mg/dL Final  . Creatinine, Ser 04/29/2016 0.80  0.40 -  1.50 mg/dL Final  . Calcium 04/29/2016 9.0  8.4 - 10.5 mg/dL Final  . GFR 04/29/2016 111.22  >60.00 mL/min Final

## 2016-05-01 NOTE — Patient Instructions (Signed)
Check blood sugars on waking up 3-4  times a week Also check blood sugars about 2 hours after a meal and do this after different meals by rotation  Recommended blood sugar levels on waking up is 90-130 and about 2 hours after meal is 130-160  Please bring your blood sugar monitor to each visit, thank you  3 Metformin at dinner and call if sugar not better

## 2016-05-21 ENCOUNTER — Other Ambulatory Visit: Payer: Self-pay | Admitting: Endocrinology

## 2016-07-08 DIAGNOSIS — E785 Hyperlipidemia, unspecified: Secondary | ICD-10-CM | POA: Diagnosis not present

## 2016-07-08 DIAGNOSIS — E559 Vitamin D deficiency, unspecified: Secondary | ICD-10-CM | POA: Diagnosis not present

## 2016-07-08 DIAGNOSIS — E119 Type 2 diabetes mellitus without complications: Secondary | ICD-10-CM | POA: Diagnosis not present

## 2016-07-28 ENCOUNTER — Other Ambulatory Visit (INDEPENDENT_AMBULATORY_CARE_PROVIDER_SITE_OTHER): Payer: BLUE CROSS/BLUE SHIELD

## 2016-07-28 DIAGNOSIS — E1140 Type 2 diabetes mellitus with diabetic neuropathy, unspecified: Secondary | ICD-10-CM | POA: Diagnosis not present

## 2016-07-28 LAB — COMPREHENSIVE METABOLIC PANEL
ALBUMIN: 4.3 g/dL (ref 3.5–5.2)
ALK PHOS: 82 U/L (ref 39–117)
ALT: 22 U/L (ref 0–53)
AST: 20 U/L (ref 0–37)
BILIRUBIN TOTAL: 0.8 mg/dL (ref 0.2–1.2)
BUN: 15 mg/dL (ref 6–23)
CO2: 29 mEq/L (ref 19–32)
Calcium: 9.5 mg/dL (ref 8.4–10.5)
Chloride: 102 mEq/L (ref 96–112)
Creatinine, Ser: 0.86 mg/dL (ref 0.40–1.50)
GFR: 102.2 mL/min (ref >60.00)
Glucose, Bld: 138 mg/dL — ABNORMAL HIGH (ref 70–99)
POTASSIUM: 4.9 meq/L (ref 3.5–5.1)
SODIUM: 138 meq/L (ref 135–145)
TOTAL PROTEIN: 7.2 g/dL (ref 6.0–8.3)

## 2016-07-28 LAB — HEMOGLOBIN A1C: Hgb A1c MFr Bld: 5.7 % (ref 4.6–6.5)

## 2016-07-31 ENCOUNTER — Encounter: Payer: Self-pay | Admitting: Endocrinology

## 2016-07-31 ENCOUNTER — Ambulatory Visit (INDEPENDENT_AMBULATORY_CARE_PROVIDER_SITE_OTHER): Payer: BLUE CROSS/BLUE SHIELD | Admitting: Endocrinology

## 2016-07-31 VITALS — BP 112/78 | HR 80 | Temp 98.8°F | Ht 66.0 in | Wt 144.4 lb

## 2016-07-31 DIAGNOSIS — E1165 Type 2 diabetes mellitus with hyperglycemia: Secondary | ICD-10-CM | POA: Diagnosis not present

## 2016-07-31 DIAGNOSIS — E1142 Type 2 diabetes mellitus with diabetic polyneuropathy: Secondary | ICD-10-CM | POA: Diagnosis not present

## 2016-07-31 NOTE — Patient Instructions (Signed)
Call in 1 week with 1-2 hrs glucose  Check blood sugars on waking up  Every 2 yrs  Also check blood sugars about 2 hours after a meal and do this after different meals by rotation  Recommended blood sugar levels on waking up is 90-130 and about 2 hours after meal is 130-160  Please bring your blood sugar monitor to each visit, thank you

## 2016-07-31 NOTE — Progress Notes (Signed)
Patient ID: Cole Craig, male   DOB: August 23, 1971, 45 y.o.   MRN: [id number]    Reason for Appointment: Diabetes follow-up   History of Present Illness   Diagnosis: Type 2 DIABETES MELITUS, date of diagnosis: 2012       He has had relatively mild diabetes which has been generally well controlled with low-dose metformin and subsequently Actos added for optimal control He generally has mostly fasting hyperglycemia but also has not been checking his blood sugars after meals on most of his visits as directed  Recent history:  Oral hypoglycemic drugs: Metformin ER 1500 mg and Actos 30 mg       Side effects from medications: None Has a relatively low A1c even though his blood sugars in the mornings are averaging 158 and slightly higher than the last visit  Current management, problems identified and blood sugar patterns:  He was told to increase his metformin to 3 tablets but he forgot and did not do so  He is still only checking his blood sugars in the mornings and not any readings after meals; he says that he goes to sleep about an hour after evening meal  He says his blood sugars may not be well controlled recently because of traveling more and not being able to exercise consistently  No side effects from current regimen of metformin and Actos        Monitors blood glucose: Once a day.    Glucometer: One Touch.          Blood Glucose readings from meter download:  BEFORE breakfast readings available only:  Recent range 132-180  MEDIAN 155, PC supper 191        Physical activity: exercise: Irregular, some walking Complications: are: Neuropathy       Wt Readings from Last 3 Encounters:  07/31/16 144 lb 6 oz (65.5 kg)  05/01/16 144 lb (65.3 kg)  01/31/16 144 lb 12.8 oz (65.7 kg)   Lab Results  Component Value Date   HGBA1C 5.7 07/28/2016   HGBA1C 5.6 04/29/2016   HGBA1C 5.2 01/28/2016   Lab Results  Component Value Date   MICROALBUR 1.0 01/28/2016   LDLCALC 55  01/28/2016   CREATININE 0.86 07/28/2016    Lab on 07/28/2016  Component Date Value Ref Range Status  . Hgb A1c MFr Bld 07/28/2016 5.7  4.6 - 6.5 % Final  . Sodium 07/28/2016 138  135 - 145 mEq/L Final  . Potassium 07/28/2016 4.9  3.5 - 5.1 mEq/L Final  . Chloride 07/28/2016 102  96 - 112 mEq/L Final  . CO2 07/28/2016 29  19 - 32 mEq/L Final  . Glucose, Bld 07/28/2016 138* 70 - 99 mg/dL Final  . BUN 07/28/2016 15  6 - 23 mg/dL Final  . Creatinine, Ser 07/28/2016 0.86  0.40 - 1.50 mg/dL Final  . Total Bilirubin 07/28/2016 0.8  0.2 - 1.2 mg/dL Final  . Alkaline Phosphatase 07/28/2016 82  39 - 117 U/L Final  . AST 07/28/2016 20  0 - 37 U/L Final  . ALT 07/28/2016 22  0 - 53 U/L Final  . Total Protein 07/28/2016 7.2  6.0 - 8.3 g/dL Final  . Albumin 07/28/2016 4.3  3.5 - 5.2 g/dL Final  . Calcium 07/28/2016 9.5  8.4 - 10.5 mg/dL Final  . GFR 07/28/2016 102.20  >60.00 mL/min Final      Medication List       Accurate as of 07/31/16 10:07 AM. Always use your  most recent med list.          atorvastatin 40 MG tablet Commonly known as:  LIPITOR TAKE 1 TABLET DAILY   DULoxetine 60 MG capsule Commonly known as:  CYMBALTA Take 1 capsule at bedtime daily   glucose blood test strip Commonly known as:  ONE TOUCH ULTRA TEST Use as instructed to check blood sugars 3 times per day Dx code E11.9   Melatonin 10 MG Caps Take by mouth.   METANX 3-90.314-2-35 MG Caps TAKE ONE CAPSULE BY MOUTH EVERY DAY   metFORMIN 750 MG 24 hr tablet Commonly known as:  GLUCOPHAGE-XR Take 2 tablets (1,500 mg total) by mouth daily with breakfast.   ONE TOUCH ULTRA 2 w/Device Kit Use to check blood sugar 3 times per day dx code 250.00   pantoprazole 40 MG tablet Commonly known as:  PROTONIX TAKE 1 TABLET DAILY   pioglitazone 30 MG tablet Commonly known as:  ACTOS Take 1 tablet (30 mg total) by mouth daily.   pioglitazone 30 MG tablet Commonly known as:  ACTOS TAKE 1 TABLET DAILY   Vitamin D3  2000 units Tabs Take 2,000 Units by mouth 2 (two) times daily.   ZENPEP 20000 units Cpep Generic drug:  Pancrelipase (Lip-Prot-Amyl) Take 25,000 Units by mouth.       Allergies:  Allergies  Allergen Reactions  . Iohexol      Code: RASH, Desc: RASH AND TONGUE SWELLING   . Ivp Dye [Iodinated Diagnostic Agents]     Past Medical History:  Diagnosis Date  . Diabetes mellitus   . GERD (gastroesophageal reflux disease)   . Hyperlipidemia   . Pancreatic cystadenoma   . Pancreatitis     Past Surgical History:  Procedure Laterality Date  . CHOLECYSTECTOMY     circa 2003 in [place], [place]  . HERNIA REPAIR    . pancreatitis surgery      circa 2003 in [place], [place]    Family History  Problem Relation Age of Onset  . Arthritis Sister   . Diabetes Mother   . Diabetes Brother     Social History:  reports that he quit smoking about 4 years ago. His smoking use included Cigars. He has never used smokeless tobacco. He reports that he drinks about 0.6 oz of alcohol per week . He reports that he does not use drugs.  Review of Systems:   He is taking 5,000 units of vitamin D3 daily   Has Past history of pancreatitis and reflux  Has previous history of fatty liver with mildly abnormal liver function tests and abnormal radiological studies also as far back as 2009  Lab Results  Component Value Date   ALT 22 07/28/2016     HYPERLIPIDEMIA: The lipid abnormality consists of elevated LDL treated with Lipitor, Followed by PCP   Lab Results  Component Value Date   CHOL 131 01/28/2016   HDL 40.70 01/28/2016   LDLCALC 55 01/28/2016   TRIG 179.0 (H) 01/28/2016   CHOLHDL 3 01/28/2016    NEUROPATHY:  He  has had symptoms of burning and sharp pains in his legs since about 2012, probably about the time of his diabetes diagnosis No symptoms of numbness in his hands although rarely may have some transient numbness in his fingertips His symptoms are usually only present at  night interfering with sleep  Since he had not been getting relief with Neurontin and later Lyrica, he has been on Cymbalta which was increased to 60 mg  With 60 mg his symptoms of burning and sharp pains are better and occur much less now Taking the Cymbalta at bedtime and usually tolerating this well He was seen by the neurologist previously and no specific diagnosis made, surprisingly had a normal nerve conduction study   Diabetic foot exam in 10/16 shows normal monofilament sensation in the toes and plantar surfaces, no skin lesions or ulcers on the feet and normal pedal pulses    Examination:   BP 112/78 (BP Location: Left Arm, Patient Position: Sitting, Cuff Size: Normal)   Pulse 80   Temp 98.8 F (37.1 C) (Oral)   Ht '5\' 6"'  (1.676 m)   Wt 144 lb 6 oz (65.5 kg)   SpO2 96%   BMI 23.30 kg/m   Body mass index is 23.3 kg/m.     ASSESSMENT/ PLAN:   Diabetes type 2 with BMI 23  See history of present illness for detailed discussion of current diabetes management, blood sugar patterns and problems identified Although his A1c is still below 6% his fasting blood sugars are averaging 158 He has only one random blood sugar of 191 last night about 30 minutes.for eating some rice with his evening meal Not clear if he has a dawn phenomenon only especially since his early morning sugars are relatively lower Recently has not been consistent with diet and exercise also Again discussed needing to check postprandial readings to decide on any change in treatment He may call us back in a week or 2 to let us know Can either increase his metformin if postprandial readings are not high or consider adding low dose Amaryl  May consider acarbose if blood sugars are consistently high after supper Encouraged him to exercise regularly  NEUROPATHY: He has  relief of symptoms with Cymbalta 60 mg and will continue    Jaynia Fendley 07/31/2016, 10:07 AM    No visits with results within 1 Day(s) from this  visit.  Latest known visit with results is:  Lab on 07/28/2016  Component Date Value Ref Range Status  . Hgb A1c MFr Bld 07/28/2016 5.7  4.6 - 6.5 % Final  . Sodium 07/28/2016 138  135 - 145 mEq/L Final  . Potassium 07/28/2016 4.9  3.5 - 5.1 mEq/L Final  . Chloride 07/28/2016 102  96 - 112 mEq/L Final  . CO2 07/28/2016 29  19 - 32 mEq/L Final  . Glucose, Bld 07/28/2016 138* 70 - 99 mg/dL Final  . BUN 07/28/2016 15  6 - 23 mg/dL Final  . Creatinine, Ser 07/28/2016 0.86  0.40 - 1.50 mg/dL Final  . Total Bilirubin 07/28/2016 0.8  0.2 - 1.2 mg/dL Final  . Alkaline Phosphatase 07/28/2016 82  39 - 117 U/L Final  . AST 07/28/2016 20  0 - 37 U/L Final  . ALT 07/28/2016 22  0 - 53 U/L Final  . Total Protein 07/28/2016 7.2  6.0 - 8.3 g/dL Final  . Albumin 07/28/2016 4.3  3.5 - 5.2 g/dL Final  . Calcium 07/28/2016 9.5  8.4 - 10.5 mg/dL Final  . GFR 07/28/2016 102.20  >60.00 mL/min Final

## 2016-08-05 DIAGNOSIS — Z23 Encounter for immunization: Secondary | ICD-10-CM | POA: Diagnosis not present

## 2016-08-08 ENCOUNTER — Other Ambulatory Visit: Payer: Self-pay | Admitting: Endocrinology

## 2016-10-13 ENCOUNTER — Other Ambulatory Visit: Payer: Self-pay | Admitting: Endocrinology

## 2016-10-27 ENCOUNTER — Other Ambulatory Visit: Payer: Self-pay

## 2016-10-27 ENCOUNTER — Other Ambulatory Visit (INDEPENDENT_AMBULATORY_CARE_PROVIDER_SITE_OTHER): Payer: BLUE CROSS/BLUE SHIELD

## 2016-10-27 DIAGNOSIS — E1165 Type 2 diabetes mellitus with hyperglycemia: Secondary | ICD-10-CM

## 2016-10-27 LAB — BASIC METABOLIC PANEL
BUN: 16 mg/dL (ref 6–23)
CALCIUM: 9.8 mg/dL (ref 8.4–10.5)
CO2: 27 mEq/L (ref 19–32)
CREATININE: 0.78 mg/dL (ref 0.40–1.50)
Chloride: 102 mEq/L (ref 96–112)
GFR: 114.26 mL/min (ref >60.00)
GLUCOSE: 166 mg/dL — AB (ref 70–99)
Potassium: 4.5 mEq/L (ref 3.5–5.1)
Sodium: 138 mEq/L (ref 135–145)

## 2016-10-27 LAB — HEMOGLOBIN A1C: Hgb A1c MFr Bld: 5.9 % (ref 4.6–6.5)

## 2016-10-27 MED ORDER — GLUCOSE BLOOD VI STRP: ORAL_STRIP | 1 refills | Status: DC

## 2016-10-30 ENCOUNTER — Ambulatory Visit (INDEPENDENT_AMBULATORY_CARE_PROVIDER_SITE_OTHER): Payer: BLUE CROSS/BLUE SHIELD | Admitting: Endocrinology

## 2016-10-30 ENCOUNTER — Encounter: Payer: Self-pay | Admitting: Endocrinology

## 2016-10-30 VITALS — BP 116/76 | HR 100 | Ht 66.0 in | Wt 142.0 lb

## 2016-10-30 DIAGNOSIS — E782 Mixed hyperlipidemia: Secondary | ICD-10-CM

## 2016-10-30 DIAGNOSIS — E1165 Type 2 diabetes mellitus with hyperglycemia: Secondary | ICD-10-CM | POA: Diagnosis not present

## 2016-10-30 DIAGNOSIS — Z23 Encounter for immunization: Secondary | ICD-10-CM | POA: Diagnosis not present

## 2016-10-30 DIAGNOSIS — E1142 Type 2 diabetes mellitus with diabetic polyneuropathy: Secondary | ICD-10-CM | POA: Diagnosis not present

## 2016-10-30 NOTE — Patient Instructions (Signed)
Take 3 Metformin daily and if ok then 4 daily at dinner  Check blood sugars on waking up 3x weekly  Also check blood sugars about 2 hours after a meal and do this after different meals by rotation  Recommended blood sugar levels on waking up is 90-130 and about 2 hours after meal is 130-160  Please bring your blood sugar monitor to each visit, thank you

## 2016-10-30 NOTE — Progress Notes (Signed)
Patient ID: Cole Craig, male   DOB: [birth year], 45 y.o.   MRN: [id number]    Reason for Appointment: Diabetes follow-up   History of Present Illness   Diagnosis: Type 2 DIABETES MELITUS, date of diagnosis: 2012       He has had relatively mild diabetes which has been generally well controlled with low-dose metformin and subsequently Actos added for optimal control He generally has mostly fasting hyperglycemia but also has not been checking his blood sugars after meals on most of his visits as directed  Recent history:  Oral hypoglycemic drugs: Metformin ER 1000 mg and Actos 30 mg       Side effects from medications: None  Has a relatively low A1c of under 6% even though his blood sugars in the mornings are consistently high  Current management, problems identified and blood sugar patterns:  He was told to increase his metformin to 3 tablets for the second time on his last visit but he forgot and did not do so  He is still only checking his blood sugars in the mornings and not any readings after meals  He says his blood sugars may not be well controlled recently because of traveling and having a busy schedule.  Also he was off his medication for 4 or 5 days about 2 weeks ago  However his fasting blood sugars are consistently high now and only occasionally below 150  He has not done any exercise  No side effects from current regimen of metformin and Actos  Monitors blood glucose: Once a day.    Glucometer: One Touch.           Blood Glucose readings from meter download:  BEFORE breakfast readings available only:    Recent range 140-211 with overall median 174, previously 155        Physical activity: exercise: Irregular  Complications: are: Neuropathy       Wt Readings from Last 3 Encounters:  10/30/16 142 lb (64.4 kg)  07/31/16 144 lb 6 oz (65.5 kg)  05/01/16 144 lb (65.3 kg)   Lab Results  Component Value Date   HGBA1C 5.9 10/27/2016   HGBA1C 5.7  07/28/2016   HGBA1C 5.6 04/29/2016   Lab Results  Component Value Date   MICROALBUR 1.0 01/28/2016   LDLCALC 55 01/28/2016   CREATININE 0.78 10/27/2016    Lab on 10/27/2016  Component Date Value Ref Range Status  . Hgb A1c MFr Bld 10/27/2016 5.9  4.6 - 6.5 % Final  . Sodium 10/27/2016 138  135 - 145 mEq/L Final  . Potassium 10/27/2016 4.5  3.5 - 5.1 mEq/L Final  . Chloride 10/27/2016 102  96 - 112 mEq/L Final  . CO2 10/27/2016 27  19 - 32 mEq/L Final  . Glucose, Bld 10/27/2016 166* 70 - 99 mg/dL Final  . BUN 10/27/2016 16  6 - 23 mg/dL Final  . Creatinine, Ser 10/27/2016 0.78  0.40 - 1.50 mg/dL Final  . Calcium 10/27/2016 9.8  8.4 - 10.5 mg/dL Final  . GFR 10/27/2016 114.26  >60.00 mL/min Final      Medication List       Accurate as of 10/30/16  9:16 AM. Always use your most recent med list.          atorvastatin 40 MG tablet Commonly known as:  LIPITOR TAKE 1 TABLET DAILY   DULoxetine 60 MG capsule Commonly known as:  CYMBALTA TAKE 1 CAPSULE AT BEDTIME   glucose blood test  strip Commonly known as:  ONE TOUCH ULTRA TEST Use as instructed to check blood sugars 3 times per day Dx code E11.9   Melatonin 10 MG Caps Take by mouth.   METANX 3-90.314-2-35 MG Caps TAKE ONE CAPSULE BY MOUTH EVERY DAY   metFORMIN 750 MG 24 hr tablet Commonly known as:  GLUCOPHAGE-XR TAKE 2 TABLETS (1,500MG    TOTAL) DAILY WITH BREAKFAST   ONE TOUCH ULTRA 2 w/Device Kit Use to check blood sugar 3 times per day dx code 250.00   pantoprazole 40 MG tablet Commonly known as:  PROTONIX TAKE 1 TABLET DAILY   pantoprazole 40 MG tablet Commonly known as:  PROTONIX TAKE 1 TABLET (40 MG TOTAL) BY MOUTH DAILY.   pioglitazone 30 MG tablet Commonly known as:  ACTOS Take 1 tablet (30 mg total) by mouth daily.   pioglitazone 30 MG tablet Commonly known as:  ACTOS TAKE 1 TABLET DAILY   Vitamin D3 2000 units Tabs Take 2,000 Units by mouth 2 (two) times daily.   ZENPEP 20000 units  Cpep Generic drug:  Pancrelipase (Lip-Prot-Amyl) Take 25,000 Units by mouth.       Allergies:  Allergies  Allergen Reactions  . Iohexol      Code: RASH, Desc: RASH AND TONGUE SWELLING   . Ivp Dye [Iodinated Diagnostic Agents]     Past Medical History:  Diagnosis Date  . Diabetes mellitus   . GERD (gastroesophageal reflux disease)   . Hyperlipidemia   . Pancreatic cystadenoma   . Pancreatitis     Past Surgical History:  Procedure Laterality Date  . CHOLECYSTECTOMY     circa 2003 in [place], [place]  . HERNIA REPAIR    . pancreatitis surgery      circa 2003 in [place], [place]    Family History  Problem Relation Age of Onset  . Arthritis Sister   . Diabetes Mother   . Diabetes Brother     Social History:  reports that he quit smoking about 4 years ago. His smoking use included Cigars. He has never used smokeless tobacco. He reports that he drinks about 0.6 oz of alcohol per week . He reports that he does not use drugs.  Review of Systems:   He is taking 5,000 units of vitamin D3 daily   Has Past history of pancreatitis and reflux, Recently had an episode of upper abdominal pain but did not have evaluation in the ER  Has previous history of fatty liver with mildly abnormal liver function tests and abnormal radiological studies also as far back as 2009  Lab Results  Component Value Date   ALT 22 07/28/2016     HYPERLIPIDEMIA: The lipid abnormality consists of elevated LDL treated with Lipitor, Followed by PCP No history of high triglycerides  Lab Results  Component Value Date   CHOL 131 01/28/2016   HDL 40.70 01/28/2016   LDLCALC 55 01/28/2016   TRIG 179.0 (H) 01/28/2016   CHOLHDL 3 01/28/2016    NEUROPATHY:  He  has had symptoms of burning and sharp pains in his legs since about 2012, probably about the time of his diabetes diagnosis No symptoms of numbness in his hands although rarely may have some transient numbness in his fingertips His symptoms  are usually only present at night interfering with sleep  Since he had not been getting relief with Neurontin and later Lyrica, he has been on Cymbalta which was increased to 60 mg With 60 mg his symptoms of burning and  sharp pains are better  Taking the Cymbalta at bedtime   He was seen by the neurologist previously and no specific diagnosis made, surprisingly had a normal nerve conduction study   Diabetic foot exam in 10/16 shows normal monofilament sensation in the toes and plantar surfaces, no skin lesions or ulcers on the feet and normal pedal pulses    Examination:   BP 116/76   Pulse 100   Ht 5' 6" (1.676 m)   Wt 142 lb (64.4 kg)   SpO2 97%   BMI 22.92 kg/m   Body mass index is 22.92 kg/m.     ASSESSMENT/ PLAN:   Diabetes type 2 with BMI 23  See history of present illness for detailed discussion of current diabetes management, blood sugar patterns and problems identified  Although his A1c is still below 6%.  Is relatively higher than before His morning sugars are continuing to increase progressively Currently only on 1000 mg of metformin ER, had been advised to increase the dose previously  He has not been able to exercise as recommended Not clear if he has postprandial hyperglycemia as he does not check any readings, previously had only one reading of 191  Since likely has highs readings fasting he should benefit from maximizing the metformin Advised him that this is safe to do and he'll need to work up to 2000 mg a day Also need to restart exercise Try to check more readings after meals Follow-up in 2 months  LIPIDS: Will need follow-up   Ajna Moors 10/30/2016, 9:16 AM    No visits with results within 1 Day(s) from this visit.  Latest known visit with results is:  Lab on 10/27/2016  Component Date Value Ref Range Status  . Hgb A1c MFr Bld 10/27/2016 5.9  4.6 - 6.5 % Final  . Sodium 10/27/2016 138  135 - 145 mEq/L Final  . Potassium 10/27/2016 4.5  3.5 -  5.1 mEq/L Final  . Chloride 10/27/2016 102  96 - 112 mEq/L Final  . CO2 10/27/2016 27  19 - 32 mEq/L Final  . Glucose, Bld 10/27/2016 166* 70 - 99 mg/dL Final  . BUN 10/27/2016 16  6 - 23 mg/dL Final  . Creatinine, Ser 10/27/2016 0.78  0.40 - 1.50 mg/dL Final  . Calcium 10/27/2016 9.8  8.4 - 10.5 mg/dL Final  . GFR 10/27/2016 114.26  >60.00 mL/min Final

## 2016-10-30 NOTE — Addendum Note (Signed)
Addended by: Nile Riggs on: 10/30/2016 10:53 AM   Modules accepted: Orders

## 2016-11-03 ENCOUNTER — Telehealth: Payer: Self-pay | Admitting: Endocrinology

## 2016-11-03 NOTE — Telephone Encounter (Signed)
Patient nee refill of glucose blood (ONE TOUCH ULTRA TEST) test strip  [facility] [place] [facility] [employer] [facility] [phone] (Phone) [phone] (Fax)

## 2016-12-23 LAB — HM DIABETES EYE EXAM

## 2016-12-31 ENCOUNTER — Other Ambulatory Visit (INDEPENDENT_AMBULATORY_CARE_PROVIDER_SITE_OTHER): Payer: BLUE CROSS/BLUE SHIELD

## 2016-12-31 DIAGNOSIS — E1165 Type 2 diabetes mellitus with hyperglycemia: Secondary | ICD-10-CM

## 2016-12-31 LAB — LIPID PANEL
CHOLESTEROL: 152 mg/dL (ref 0–200)
HDL: 47 mg/dL (ref >39.00)
NonHDL: 105.01
Total CHOL/HDL Ratio: 3
Triglycerides: 203 mg/dL — ABNORMAL HIGH (ref 0.0–149.0)
VLDL: 40.6 mg/dL — AB (ref 0.0–40.0)

## 2016-12-31 LAB — COMPREHENSIVE METABOLIC PANEL
ALBUMIN: 4.6 g/dL (ref 3.5–5.2)
ALK PHOS: 92 U/L (ref 39–117)
ALT: 37 U/L (ref 0–53)
AST: 23 U/L (ref 0–37)
BUN: 20 mg/dL (ref 6–23)
CALCIUM: 9.3 mg/dL (ref 8.4–10.5)
CO2: 31 mEq/L (ref 19–32)
CREATININE: 0.82 mg/dL (ref 0.40–1.50)
Chloride: 101 mEq/L (ref 96–112)
GFR: 107.76 mL/min (ref >60.00)
Glucose, Bld: 136 mg/dL — ABNORMAL HIGH (ref 70–99)
Potassium: 4.6 mEq/L (ref 3.5–5.1)
SODIUM: 137 meq/L (ref 135–145)
TOTAL PROTEIN: 7.4 g/dL (ref 6.0–8.3)
Total Bilirubin: 0.8 mg/dL (ref 0.2–1.2)

## 2016-12-31 LAB — LDL CHOLESTEROL, DIRECT: LDL DIRECT: 80 mg/dL

## 2017-01-01 LAB — FRUCTOSAMINE: Fructosamine: 321 umol/L — ABNORMAL HIGH (ref 0–285)

## 2017-01-05 ENCOUNTER — Ambulatory Visit: Payer: BLUE CROSS/BLUE SHIELD | Admitting: Endocrinology

## 2017-01-06 ENCOUNTER — Other Ambulatory Visit: Payer: Self-pay

## 2017-01-06 ENCOUNTER — Ambulatory Visit (INDEPENDENT_AMBULATORY_CARE_PROVIDER_SITE_OTHER): Payer: BLUE CROSS/BLUE SHIELD | Admitting: Endocrinology

## 2017-01-06 ENCOUNTER — Encounter: Payer: Self-pay | Admitting: Endocrinology

## 2017-01-06 VITALS — BP 128/70 | HR 71 | Ht 66.0 in | Wt 144.0 lb

## 2017-01-06 DIAGNOSIS — E1165 Type 2 diabetes mellitus with hyperglycemia: Secondary | ICD-10-CM | POA: Diagnosis not present

## 2017-01-06 MED ORDER — GLIMEPIRIDE 1 MG PO TABS: ORAL_TABLET | ORAL | 1 refills | Status: DC

## 2017-01-06 MED ORDER — METFORMIN HCL ER 750 MG PO TB24: ORAL_TABLET | ORAL | 1 refills | Status: DC

## 2017-01-06 NOTE — Progress Notes (Signed)
Patient ID: Cole Craig, male   DOB: [birth year], 46 y.o.   MRN: [id number]    Reason for Appointment: Diabetes follow-up   History of Present Illness   Diagnosis: Type 2 DIABETES MELITUS, date of diagnosis: 2012       He has had relatively mild diabetes which has been generally well controlled with low-dose metformin and subsequently Actos added for optimal control He generally has mostly fasting hyperglycemia but also has not been checking his blood sugars after meals on most of his visits as directed  Recent history:  Oral hypoglycemic drugs: Metformin ER 2250 mg and Actos 30 mg       Side effects from medications: None  Has had a relatively low A1c of under 6% even though his blood sugars in the mornings are consistently high Fructosamine is higher 321  Current management, problems identified and blood sugar patterns:  He was told to increase his metformin to 3 tablets but does not appear to have had any improvement in his blood sugars  He says he has been traveling to [place] in the last month and eating poorly because of attending weddings  Although he thinks he is doing some readings at night difficult to know which readings are late right night because of the time change on his meter; however readings at night appear to be similar to the morning readings  More recently his blood sugars are staying around 160-180 range in the morning  He has not done any exercise again in the last few weeks  No side effects from current regimen of metformin and Actos  Monitors blood glucose: Once a day.    Glucometer: One Touch.           Blood Glucose readings from meter download:  Recent range 93-255 with MEDIAN 170        Physical activity: exercise: Irregular  Complications: are: Neuropathy       Wt Readings from Last 3 Encounters:  01/06/17 144 lb (65.3 kg)  10/30/16 142 lb (64.4 kg)  07/31/16 144 lb 6 oz (65.5 kg)   Lab Results  Component Value Date   HGBA1C  5.9 10/27/2016   HGBA1C 5.7 07/28/2016   HGBA1C 5.6 04/29/2016   Lab Results  Component Value Date   MICROALBUR 1.0 01/28/2016   LDLCALC 55 01/28/2016   CREATININE 0.82 12/31/2016    Lab on 12/31/2016  Component Date Value Ref Range Status  . Fructosamine 01/01/2017 321* 0 - 285 umol/L Final   Comment: Published reference interval for apparently healthy subjects between age 64 and 37 is 22 - 285 umol/L and in a poorly controlled diabetic population is 228 - 563 umol/L with a mean of 396 umol/L.   Marland Kitchen Cholesterol 12/31/2016 152  0 - 200 mg/dL Final  . Triglycerides 12/31/2016 203.0* 0.0 - 149.0 mg/dL Final  . HDL 12/31/2016 47.00  >39.00 mg/dL Final  . VLDL 12/31/2016 40.6* 0.0 - 40.0 mg/dL Final  . Total CHOL/HDL Ratio 12/31/2016 3   Final  . NonHDL 12/31/2016 105.01   Final  . Sodium 12/31/2016 137  135 - 145 mEq/L Final  . Potassium 12/31/2016 4.6  3.5 - 5.1 mEq/L Final  . Chloride 12/31/2016 101  96 - 112 mEq/L Final  . CO2 12/31/2016 31  19 - 32 mEq/L Final  . Glucose, Bld 12/31/2016 136* 70 - 99 mg/dL Final  . BUN 12/31/2016 20  6 - 23 mg/dL Final  . Creatinine, Ser 12/31/2016 0.82  0.40 - 1.50 mg/dL Final  . Total Bilirubin 12/31/2016 0.8  0.2 - 1.2 mg/dL Final  . Alkaline Phosphatase 12/31/2016 92  39 - 117 U/L Final  . AST 12/31/2016 23  0 - 37 U/L Final  . ALT 12/31/2016 37  0 - 53 U/L Final  . Total Protein 12/31/2016 7.4  6.0 - 8.3 g/dL Final  . Albumin 12/31/2016 4.6  3.5 - 5.2 g/dL Final  . Calcium 12/31/2016 9.3  8.4 - 10.5 mg/dL Final  . GFR 12/31/2016 107.76  >60.00 mL/min Final  . Direct LDL 12/31/2016 80.0  mg/dL Final    Allergies as of 01/06/2017      Reactions   Iohexol     Code: RASH, Desc: RASH AND TONGUE SWELLING   Ivp Dye [iodinated Diagnostic Agents]       Medication List       Accurate as of 01/06/17  3:49 PM. Always use your most recent med list.          atorvastatin 40 MG tablet Commonly known as:  LIPITOR TAKE 1 TABLET DAILY     DULoxetine 60 MG capsule Commonly known as:  CYMBALTA TAKE 1 CAPSULE AT BEDTIME   glucose blood test strip Commonly known as:  ONE TOUCH ULTRA TEST Use as instructed to check blood sugars 3 times per day Dx code E11.9   Melatonin 10 MG Caps Take by mouth.   METANX 3-90.314-2-35 MG Caps TAKE ONE CAPSULE BY MOUTH EVERY DAY   metFORMIN 750 MG 24 hr tablet Commonly known as:  GLUCOPHAGE-XR TAKE 2 TABLETS (1,500MG    TOTAL) DAILY WITH BREAKFAST   ONE TOUCH ULTRA 2 w/Device Kit Use to check blood sugar 3 times per day dx code 250.00   pantoprazole 40 MG tablet Commonly known as:  PROTONIX TAKE 1 TABLET DAILY   pantoprazole 40 MG tablet Commonly known as:  PROTONIX TAKE 1 TABLET (40 MG TOTAL) BY MOUTH DAILY.   pioglitazone 30 MG tablet Commonly known as:  ACTOS Take 1 tablet (30 mg total) by mouth daily.   pioglitazone 30 MG tablet Commonly known as:  ACTOS TAKE 1 TABLET DAILY   Vitamin D3 2000 units Tabs Take 2,000 Units by mouth 2 (two) times daily.   ZENPEP 20000 units Cpep Generic drug:  Pancrelipase (Lip-Prot-Amyl) Take 25,000 Units by mouth.       Allergies:  Allergies  Allergen Reactions  . Iohexol      Code: RASH, Desc: RASH AND TONGUE SWELLING   . Ivp Dye [Iodinated Diagnostic Agents]     Past Medical History:  Diagnosis Date  . Diabetes mellitus   . GERD (gastroesophageal reflux disease)   . Hyperlipidemia   . Pancreatic cystadenoma   . Pancreatitis     Past Surgical History:  Procedure Laterality Date  . CHOLECYSTECTOMY     circa 2003 in [place], [place]  . HERNIA REPAIR    . pancreatitis surgery      circa 2003 in [place], [place]    Family History  Problem Relation Age of Onset  . Arthritis Sister   . Diabetes Mother   . Diabetes Brother     Social History:  reports that he quit smoking about 5 years ago. His smoking use included Cigars. He has never used smokeless tobacco. He reports that he drinks about 0.6 oz of alcohol per  week . He reports that he does not use drugs.  Review of Systems:    Has Past history of  pancreatitis and reflux, Recently had an episode of upper abdominal pain but did not have evaluation in the ER  Has previous history of fatty liver with mildly abnormal liver function tests and abnormal radiological studies also as far back as 2009  Lab Results  Component Value Date   ALT 37 12/31/2016     HYPERLIPIDEMIA: The lipid abnormality consists of elevated LDL treated with Lipitor No history of high triglycerides Previously but these are higher now possibly from poor diet  Lab Results  Component Value Date   CHOL 152 12/31/2016   HDL 47.00 12/31/2016   LDLCALC 55 01/28/2016   LDLDIRECT 80.0 12/31/2016   TRIG 203.0 (H) 12/31/2016   CHOLHDL 3 12/31/2016    NEUROPATHY:  He  has had symptoms of burning and sharp pains in his legs since about 2012, probably about the time of his diabetes diagnosis No symptoms of numbness in his hands although rarely may have some transient numbness in his fingertips His symptoms are usually only present at night interfering with sleep  Since he had not been getting relief with Neurontin and later Lyrica, he has been on Cymbalta which was increased to 60 mg With 60 mg his symptoms of burning and sharp pains are better  Taking the Cymbalta at bedtime   He was seen by the neurologist previously and no specific diagnosis made, surprisingly had a normal nerve conduction study   Diabetic foot exam in 10/16 shows normal monofilament sensation in the toes and plantar surfaces, no skin lesions or ulcers on the feet and normal pedal pulses    Examination:   BP 128/70   Pulse 71   Ht '5\' 6"'  (1.676 m)   Wt 144 lb (65.3 kg)   SpO2 97%   BMI 23.24 kg/m   Body mass index is 23.24 kg/m.     ASSESSMENT/ PLAN:   Diabetes type 2 with BMI 23  See history of present illness for detailed discussion of current diabetes management, blood sugar patterns and  problems identified  His high fructosamine indicates inadequate control He is averaging about 170 with his home blood sugars and appears to have similar readings in the morning and late at night He does likely have insulin deficiency and may benefit from a trial of low-dose Amaryl, for now he can start taking 1 mg at dinner and may need to increase this, also discussed possibility of hypoglycemia He can reduce metformin back to 1500 mg for now  Meanwhile he does need to get back into exercise also Follow-up in 2 months  LIPIDS: LDL controlled with high triglycerides may be related to inconsistent diet    Patient Instructions  Check blood sugars on waking up  3x weekly  Also check blood sugars about 2 hours after a meal and do this after different meals by rotation  Recommended blood sugar levels on waking up is 90-130 and about 2 hours after meal is 130-160  Please bring your blood sugar monitor to each visit, thank you  Metformin 2 tabs    Danah Reinecke 01/06/2017, 3:49 PM    No visits with results within 1 Day(s) from this visit.  Latest known visit with results is:  Lab on 12/31/2016  Component Date Value Ref Range Status  . Fructosamine 01/01/2017 321* 0 - 285 umol/L Final   Comment: Published reference interval for apparently healthy subjects between age 91 and 27 is 20 - 285 umol/L and in a poorly controlled diabetic population is 228 - 563 umol/L  with a mean of 396 umol/L.   Marland Kitchen Cholesterol 12/31/2016 152  0 - 200 mg/dL Final  . Triglycerides 12/31/2016 203.0* 0.0 - 149.0 mg/dL Final  . HDL 12/31/2016 47.00  >39.00 mg/dL Final  . VLDL 12/31/2016 40.6* 0.0 - 40.0 mg/dL Final  . Total CHOL/HDL Ratio 12/31/2016 3   Final  . NonHDL 12/31/2016 105.01   Final  . Sodium 12/31/2016 137  135 - 145 mEq/L Final  . Potassium 12/31/2016 4.6  3.5 - 5.1 mEq/L Final  . Chloride 12/31/2016 101  96 - 112 mEq/L Final  . CO2 12/31/2016 31  19 - 32 mEq/L Final  . Glucose, Bld  12/31/2016 136* 70 - 99 mg/dL Final  . BUN 12/31/2016 20  6 - 23 mg/dL Final  . Creatinine, Ser 12/31/2016 0.82  0.40 - 1.50 mg/dL Final  . Total Bilirubin 12/31/2016 0.8  0.2 - 1.2 mg/dL Final  . Alkaline Phosphatase 12/31/2016 92  39 - 117 U/L Final  . AST 12/31/2016 23  0 - 37 U/L Final  . ALT 12/31/2016 37  0 - 53 U/L Final  . Total Protein 12/31/2016 7.4  6.0 - 8.3 g/dL Final  . Albumin 12/31/2016 4.6  3.5 - 5.2 g/dL Final  . Calcium 12/31/2016 9.3  8.4 - 10.5 mg/dL Final  . GFR 12/31/2016 107.76  >60.00 mL/min Final  . Direct LDL 12/31/2016 80.0  mg/dL Final

## 2017-01-06 NOTE — Patient Instructions (Addendum)
Check blood sugars on waking up  3x weekly  Also check blood sugars about 2 hours after a meal and do this after different meals by rotation  Recommended blood sugar levels on waking up is 90-130 and about 2 hours after meal is 130-160  Please bring your blood sugar monitor to each visit, thank you  Metformin 2 tabs

## 2017-01-08 ENCOUNTER — Other Ambulatory Visit: Payer: Self-pay

## 2017-01-08 ENCOUNTER — Telehealth: Payer: Self-pay | Admitting: Endocrinology

## 2017-01-08 MED ORDER — METFORMIN HCL ER 750 MG PO TB24: ORAL_TABLET | ORAL | 1 refills | Status: DC

## 2017-01-08 NOTE — Telephone Encounter (Signed)
Glucophage (Metformin) dosage concern due to high dose alert.  Ph- [phone] Ref [id number]

## 2017-01-12 NOTE — Telephone Encounter (Signed)
Spoke with the pharmacy and the patients prescription was reduced from 3 tablets to 2 per Dr. Dwyane Dee- the pharmacy stated an understanding

## 2017-01-16 ENCOUNTER — Other Ambulatory Visit: Payer: Self-pay | Admitting: Endocrinology

## 2017-02-01 ENCOUNTER — Other Ambulatory Visit: Payer: Self-pay | Admitting: Endocrinology

## 2017-03-02 ENCOUNTER — Other Ambulatory Visit (INDEPENDENT_AMBULATORY_CARE_PROVIDER_SITE_OTHER): Payer: BLUE CROSS/BLUE SHIELD

## 2017-03-02 DIAGNOSIS — E1165 Type 2 diabetes mellitus with hyperglycemia: Secondary | ICD-10-CM

## 2017-03-02 LAB — COMPREHENSIVE METABOLIC PANEL
ALT: 19 U/L (ref 0–53)
AST: 20 U/L (ref 0–37)
Albumin: 4.4 g/dL (ref 3.5–5.2)
Alkaline Phosphatase: 71 U/L (ref 39–117)
BUN: 15 mg/dL (ref 6–23)
CO2: 30 mEq/L (ref 19–32)
Calcium: 9.1 mg/dL (ref 8.4–10.5)
Chloride: 105 mEq/L (ref 96–112)
Creatinine, Ser: 0.86 mg/dL (ref 0.40–1.50)
GFR: 101.93 mL/min (ref >60.00)
Glucose, Bld: 97 mg/dL (ref 70–99)
Potassium: 4.6 mEq/L (ref 3.5–5.1)
Sodium: 140 mEq/L (ref 135–145)
Total Bilirubin: 0.6 mg/dL (ref 0.2–1.2)
Total Protein: 6.9 g/dL (ref 6.0–8.3)

## 2017-03-02 LAB — HEMOGLOBIN A1C: HEMOGLOBIN A1C: 5.2 % (ref 4.6–6.5)

## 2017-03-05 ENCOUNTER — Ambulatory Visit (INDEPENDENT_AMBULATORY_CARE_PROVIDER_SITE_OTHER): Payer: BLUE CROSS/BLUE SHIELD | Admitting: Endocrinology

## 2017-03-05 ENCOUNTER — Encounter: Payer: Self-pay | Admitting: Endocrinology

## 2017-03-05 VITALS — BP 110/76 | HR 84 | Ht 66.0 in | Wt 149.0 lb

## 2017-03-05 DIAGNOSIS — E1142 Type 2 diabetes mellitus with diabetic polyneuropathy: Secondary | ICD-10-CM | POA: Diagnosis not present

## 2017-03-05 DIAGNOSIS — IMO0001 Reserved for inherently not codable concepts without codable children: Secondary | ICD-10-CM

## 2017-03-05 DIAGNOSIS — E1165 Type 2 diabetes mellitus with hyperglycemia: Secondary | ICD-10-CM

## 2017-03-05 LAB — MICROALBUMIN / CREATININE URINE RATIO
CREATININE, U: 201.1 mg/dL
MICROALB/CREAT RATIO: 0.5 mg/g (ref 0.0–30.0)
Microalb, Ur: 1.1 mg/dL (ref 0.0–1.9)

## 2017-03-05 NOTE — Progress Notes (Signed)
Patient ID: Cole Craig, male   DOB: [birth year], 46 y.o.   MRN: [id number]    Reason for Appointment: Diabetes follow-up   History of Present Illness   Diagnosis: Type 2 DIABETES MELITUS, date of diagnosis: 2012       He has had relatively mild diabetes which has been generally well controlled with low-dose metformin and subsequently Actos added for optimal control He generally has mostly fasting hyperglycemia but also has not been checking his blood sugars after meals on most of his visits as directed  Recent history:  Oral hypoglycemic drugs: Metformin ER 1500 mg, Amaryl 1 mg at dinner and Actos 30 mg       Side effects from medications: None  Has had a relatively low A1c of under 6% even though his blood sugars are relatively higher  This is now much better at 5.2 compared to 5.9 with starting Amaryl in January 2018   Current management, problems identified and blood sugar patterns:  He has started taking Amaryl at dinnertime since his last visit because of persistently high fasting readings despite taking 2250 mg metformin   He was told to reduce to metformin to 2 tablets  He has also been somewhat more compliant with his diet compared to the last visit  His fasting blood sugars are much better now and more consistent, previously his median reading was 170  He has no hypoglycemia with 1 mg Amaryl  Also readings after supper are fairly well controlled  He is exercising only a couple of times a week and not clear why his weight has gone up   Monitors blood glucose: Once a day.    Glucometer: One Touch.           Blood Glucose readings from meter download:  Recent range  FASTING 102-151 with median about 118  After 9 PM 119-153 with median about 140        Physical activity: exercise: 2/7 days on treadmill or walking  Complications: are: Neuropathy       Wt Readings from Last 3 Encounters:  03/05/17 149 lb (67.6 kg)  01/06/17 144 lb (65.3 kg)    10/30/16 142 lb (64.4 kg)   Lab Results  Component Value Date   HGBA1C 5.2 03/02/2017   HGBA1C 5.9 10/27/2016   HGBA1C 5.7 07/28/2016   Lab Results  Component Value Date   MICROALBUR 1.1 03/05/2017   LDLCALC 55 01/28/2016   CREATININE 0.86 03/02/2017    Office Visit on 03/05/2017  Component Date Value Ref Range Status  . Microalb, Ur 03/05/2017 1.1  0.0 - 1.9 mg/dL Final  . Creatinine,U 03/05/2017 201.1  mg/dL Final  . Microalb Creat Ratio 03/05/2017 0.5  0.0 - 30.0 mg/g Final  Lab on 03/02/2017  Component Date Value Ref Range Status  . Hgb A1c MFr Bld 03/02/2017 5.2  4.6 - 6.5 % Final  . Sodium 03/02/2017 140  135 - 145 mEq/L Final  . Potassium 03/02/2017 4.6  3.5 - 5.1 mEq/L Final  . Chloride 03/02/2017 105  96 - 112 mEq/L Final  . CO2 03/02/2017 30  19 - 32 mEq/L Final  . Glucose, Bld 03/02/2017 97  70 - 99 mg/dL Final  . BUN 03/02/2017 15  6 - 23 mg/dL Final  . Creatinine, Ser 03/02/2017 0.86  0.40 - 1.50 mg/dL Final  . Total Bilirubin 03/02/2017 0.6  0.2 - 1.2 mg/dL Final  . Alkaline Phosphatase 03/02/2017 71  39 - 117 U/L Final  .  AST 03/02/2017 20  0 - 37 U/L Final  . ALT 03/02/2017 19  0 - 53 U/L Final  . Total Protein 03/02/2017 6.9  6.0 - 8.3 g/dL Final  . Albumin 03/02/2017 4.4  3.5 - 5.2 g/dL Final  . Calcium 03/02/2017 9.1  8.4 - 10.5 mg/dL Final  . GFR 03/02/2017 101.93  >60.00 mL/min Final    Allergies as of 03/05/2017      Reactions   Iohexol     Code: RASH, Desc: RASH AND TONGUE SWELLING   Ivp Dye [iodinated Diagnostic Agents]       Medication List       Accurate as of 03/05/17 12:50 PM. Always use your most recent med list.          atorvastatin 40 MG tablet Commonly known as:  LIPITOR TAKE 1 TABLET DAILY   DULoxetine 60 MG capsule Commonly known as:  CYMBALTA TAKE 1 CAPSULE AT BEDTIME   glimepiride 1 MG tablet Commonly known as:  AMARYL 1 tab at dinner daily   glucose blood test strip Commonly known as:  ONE TOUCH ULTRA TEST Use as  instructed to check blood sugars 3 times per day Dx code E11.9   Melatonin 10 MG Caps Take by mouth.   METANX 3-90.314-2-35 MG Caps TAKE ONE CAPSULE BY MOUTH EVERY DAY   metFORMIN 750 MG 24 hr tablet Commonly known as:  GLUCOPHAGE-XR TAKE 2 TABLETS (1,500MG    TOTAL) DAILY WITH BREAKFAST   ONE TOUCH ULTRA 2 w/Device Kit Use to check blood sugar 3 times per day dx code 250.00   pantoprazole 40 MG tablet Commonly known as:  PROTONIX TAKE 1 TABLET DAILY   pantoprazole 40 MG tablet Commonly known as:  PROTONIX TAKE 1 TABLET (40 MG TOTAL) BY MOUTH DAILY.   pioglitazone 30 MG tablet Commonly known as:  ACTOS Take 1 tablet (30 mg total) by mouth daily.   pioglitazone 30 MG tablet Commonly known as:  ACTOS TAKE 1 TABLET DAILY   Vitamin D3 2000 units Tabs Take 2,000 Units by mouth 2 (two) times daily.   ZENPEP 20000 units Cpep Generic drug:  Pancrelipase (Lip-Prot-Amyl) Take 25,000 Units by mouth.       Allergies:  Allergies  Allergen Reactions  . Iohexol      Code: RASH, Desc: RASH AND TONGUE SWELLING   . Ivp Dye [Iodinated Diagnostic Agents]     Past Medical History:  Diagnosis Date  . Diabetes mellitus   . GERD (gastroesophageal reflux disease)   . Hyperlipidemia   . Pancreatic cystadenoma   . Pancreatitis     Past Surgical History:  Procedure Laterality Date  . CHOLECYSTECTOMY     circa 2003 in [place], [place]  . HERNIA REPAIR    . pancreatitis surgery      circa 2003 in [place], [place]    Family History  Problem Relation Age of Onset  . Arthritis Sister   . Diabetes Mother   . Diabetes Brother     Social History:  reports that he quit smoking about 5 years ago. His smoking use included Cigars. He has never used smokeless tobacco. He reports that he drinks about 0.6 oz of alcohol per week . He reports that he does not use drugs.  Review of Systems:    Has previous history of fatty liver with mildly abnormal liver function tests and abnormal  radiological studies also as far back as 2009  Lab Results  Component Value Date  ALT 19 03/02/2017     HYPERLIPIDEMIA: The lipid abnormality consists of elevated LDL treated with Lipitor No history of high triglycerides, on his last visit triglycerides were 203 from inconsistent diet     Lab Results  Component Value Date   CHOL 152 12/31/2016   HDL 47.00 12/31/2016   LDLCALC 55 01/28/2016   LDLDIRECT 80.0 12/31/2016   TRIG 203.0 (H) 12/31/2016   CHOLHDL 3 12/31/2016    NEUROPATHY:  He  has had symptoms of burning and sharp pains in his legs since about 2012, probably about the time of his diabetes diagnosis No symptoms of numbness in his hands although rarely may have some transient numbness in his fingertips His symptoms are usually only present at night   Since he had not been getting relief with Neurontin and later Lyrica, he has been on Cymbalta which was increased to 60 mg With 60 mg his symptoms of burning and sharp pains are generally controlled  He was seen by the neurologist previously and no specific diagnosis made, surprisingly had a normal nerve conduction study   Diabetic foot exam in 10/16 shows normal monofilament sensation in the toes and plantar surfaces, no skin lesions or ulcers on the feet and normal pedal pulses    Examination:   BP 110/76   Pulse 84   Ht '5\' 6"'  (1.676 m)   Wt 149 lb (67.6 kg)   SpO2 97%   BMI 24.05 kg/m   Body mass index is 24.05 kg/m.     ASSESSMENT/ PLAN:   Diabetes type 2 with BMI 23  See history of present illness for detailed discussion of current diabetes management, blood sugar patterns and problems identified  His A1c is usually falsely low, now 5.2 Blood sugars are much better controlled with adding Amaryl in the evening Fasting readings are averaging about 120, previously 170 This is even with reducing metformin down to 1500  He will continue the same regimen Encouraged him to be consistent with exercise and  also continue healthy diet Again he should alternate sugar monitoring between fasting and after meals  LIPIDS: Recheck on next visit    Patient Instructions  Check blood sugars on waking up  3x weekly  Also check blood sugars about 2 hours after a meal and do this after different meals by rotation  Recommended blood sugar levels on waking up is 90-130 and about 2 hours after meal is 130-160  Please bring your blood sugar monitor to each visit, thank you    [facility] 03/05/2017, 12:50 PM    Office Visit on 03/05/2017  Component Date Value Ref Range Status  . Microalb, Ur 03/05/2017 1.1  0.0 - 1.9 mg/dL Final  . Creatinine,U 03/05/2017 201.1  mg/dL Final  . Microalb Creat Ratio 03/05/2017 0.5  0.0 - 30.0 mg/g Final

## 2017-03-05 NOTE — Patient Instructions (Signed)
Check blood sugars on waking up  3x weekly  Also check blood sugars about 2 hours after a meal and do this after different meals by rotation  Recommended blood sugar levels on waking up is 90-130 and about 2 hours after meal is 130-160  Please bring your blood sugar monitor to each visit, thank you  

## 2017-03-31 ENCOUNTER — Other Ambulatory Visit: Payer: Self-pay

## 2017-03-31 MED ORDER — GLUCOSE BLOOD VI STRP: ORAL_STRIP | 1 refills | Status: DC

## 2017-04-05 ENCOUNTER — Other Ambulatory Visit: Payer: Self-pay | Admitting: Endocrinology

## 2017-04-13 ENCOUNTER — Other Ambulatory Visit: Payer: Self-pay | Admitting: Endocrinology

## 2017-07-02 ENCOUNTER — Other Ambulatory Visit (INDEPENDENT_AMBULATORY_CARE_PROVIDER_SITE_OTHER): Payer: BLUE CROSS/BLUE SHIELD

## 2017-07-02 DIAGNOSIS — E1165 Type 2 diabetes mellitus with hyperglycemia: Secondary | ICD-10-CM | POA: Diagnosis not present

## 2017-07-02 DIAGNOSIS — IMO0001 Reserved for inherently not codable concepts without codable children: Secondary | ICD-10-CM

## 2017-07-02 LAB — LIPID PANEL
Cholesterol: 159 mg/dL (ref 0–200)
HDL: 48 mg/dL (ref >39.00)
LDL Cholesterol: 80 mg/dL (ref 0–99)
NONHDL: 111.04
Total CHOL/HDL Ratio: 3
Triglycerides: 153 mg/dL — ABNORMAL HIGH (ref 0.0–149.0)
VLDL: 30.6 mg/dL (ref 0.0–40.0)

## 2017-07-02 LAB — BASIC METABOLIC PANEL
BUN: 17 mg/dL (ref 6–23)
CHLORIDE: 102 meq/L (ref 96–112)
CO2: 28 meq/L (ref 19–32)
Calcium: 9.4 mg/dL (ref 8.4–10.5)
Creatinine, Ser: 0.95 mg/dL (ref 0.40–1.50)
GFR: 90.73 mL/min (ref >60.00)
GLUCOSE: 95 mg/dL (ref 70–99)
POTASSIUM: 4.1 meq/L (ref 3.5–5.1)
SODIUM: 139 meq/L (ref 135–145)

## 2017-07-02 LAB — HEMOGLOBIN A1C: Hgb A1c MFr Bld: 5 % (ref 4.6–6.5)

## 2017-07-05 NOTE — Progress Notes (Signed)
Patient ID: Cole Craig, male   DOB: 02-Feb-1971, 46 y.o.   MRN: [id number]    Reason for Appointment: Diabetes follow-up   History of Present Illness   Diagnosis: Type 2 DIABETES MELITUS, date of diagnosis: 2012       He has had relatively mild diabetes which has been generally well controlled with low-dose metformin and subsequently Actos added for optimal control He generally has mostly fasting hyperglycemia but also has not been checking his blood sugars after meals on most of his visits as directed  Recent history:  Oral hypoglycemic drugs: Metformin ER 1500 mg, Amaryl 1 mg at dinner and Actos 30 mg       Side effects from medications: None  Has had a relatively low A1c of under 6% even though his blood sugars are relatively higher  His A1c is 5%, previously 5.2 and 5.9  Current management, problems identified and blood sugar patterns:  He has continued to have good readings in the mornings with taking Amaryl 1 mg  No hypoglycemia although he once had a blood sugar of 66 without symptoms  Overall no weight change  He is now exercising more regularly as he has exercise equipment at home and sometimes will go walking also  started taking Amaryl at dinnertime since his last visit because of persistently high fasting readings despite taking 2250 mg metformin  He is does have some variability in his diet and not always consistent but he does not check readings after supper especially recently  Overall fasting blood sugars are mildly increased but mostly under 125   Monitors blood glucose: < Once a day.    Glucometer: One Touch.           Blood Glucose readings from meter download:  Recent range  FASTING 77-1 45 with median 118        Physical activity: exercise: 3/7 days on treadmill or walking  Complications: are: Neuropathy       Wt Readings from Last 3 Encounters:  07/06/17 150 lb 3.2 oz (68.1 kg)  03/05/17 149 lb (67.6 kg)  01/06/17 144 lb (65.3  kg)   Lab Results  Component Value Date   HGBA1C 5.0 07/02/2017   HGBA1C 5.2 03/02/2017   HGBA1C 5.9 10/27/2016   Lab Results  Component Value Date   MICROALBUR 1.1 03/05/2017   [place] 80 07/02/2017   CREATININE 0.95 07/02/2017    Appointment on 07/02/2017  Component Date Value Ref Range Status  . Hgb A1c MFr Bld 07/02/2017 5.0  4.6 - 6.5 % Final   Glycemic Control Guidelines for People with Diabetes:Non Diabetic:  <6%Goal of Therapy: <7%Additional Action Suggested:  >8%   . Sodium 07/02/2017 139  135 - 145 mEq/L Final  . Potassium 07/02/2017 4.1  3.5 - 5.1 mEq/L Final  . Chloride 07/02/2017 102  96 - 112 mEq/L Final  . CO2 07/02/2017 28  19 - 32 mEq/L Final  . Glucose, Bld 07/02/2017 95  70 - 99 mg/dL Final  . BUN 07/02/2017 17  6 - 23 mg/dL Final  . Creatinine, Ser 07/02/2017 0.95  0.40 - 1.50 mg/dL Final  . Calcium 07/02/2017 9.4  8.4 - 10.5 mg/dL Final  . GFR 07/02/2017 90.73  >60.00 mL/min Final  . Cholesterol 07/02/2017 159  0 - 200 mg/dL Final   ATP III Classification       Desirable:  < 200 mg/dL  Borderline High:  200 - 239 mg/dL          High:  > = 240 mg/dL  . Triglycerides 07/02/2017 153.0* 0.0 - 149.0 mg/dL Final   Normal:  <150 mg/dLBorderline High:  150 - 199 mg/dL  . HDL 07/02/2017 48.00  >39.00 mg/dL Final  . VLDL 07/02/2017 30.6  0.0 - 40.0 mg/dL Final  . LDL Cholesterol 07/02/2017 80  0 - 99 mg/dL Final  . Total CHOL/HDL Ratio 07/02/2017 3   Final                  Men          Women1/2 Average Risk     3.4          3.3Average Risk          5.0          4.42X Average Risk          9.6          7.13X Average Risk          15.0          11.0                      . NonHDL 07/02/2017 111.04   Final   NOTE:  Non-HDL goal should be 30 mg/dL higher than patient's LDL goal (i.e. LDL goal of < 70 mg/dL, would have non-HDL goal of < 100 mg/dL)    Allergies as of 07/06/2017      Reactions   Iohexol     Code: RASH, Desc: RASH AND TONGUE SWELLING   Ivp  Dye [iodinated Diagnostic Agents]       Medication List       Accurate as of 07/06/17 11:04 AM. Always use your most recent med list.          atorvastatin 40 MG tablet Commonly known as:  LIPITOR TAKE 1 TABLET DAILY   DULoxetine 60 MG capsule Commonly known as:  CYMBALTA TAKE 1 CAPSULE AT BEDTIME   glimepiride 1 MG tablet Commonly known as:  AMARYL 1 tab at dinner daily   glucose blood test strip Commonly known as:  ONE TOUCH ULTRA TEST Use as instructed to check blood sugars 3 times per day Dx code E11.9   Melatonin 10 MG Caps Take by mouth.   metFORMIN 750 MG 24 hr tablet Commonly known as:  GLUCOPHAGE-XR TAKE 2 TABLETS (1,500MG    TOTAL) DAILY WITH BREAKFAST   ONE TOUCH ULTRA 2 w/Device Kit Use to check blood sugar 3 times per day dx code 250.00   pantoprazole 40 MG tablet Commonly known as:  PROTONIX TAKE 1 TABLET (40 MG TOTAL) BY MOUTH DAILY.   pioglitazone 30 MG tablet Commonly known as:  ACTOS TAKE 1 TABLET DAILY   Vitamin D3 2000 units Tabs Take 2,000 Units by mouth 2 (two) times daily.   ZENPEP 20000-68000 units Cpep Generic drug:  Pancrelipase (Lip-Prot-Amyl) Take 25,000 Units by mouth.       Allergies:  Allergies  Allergen Reactions  . Iohexol      Code: RASH, Desc: RASH AND TONGUE SWELLING   . Ivp Dye [Iodinated Diagnostic Agents]     Past Medical History:  Diagnosis Date  . Diabetes mellitus   . GERD (gastroesophageal reflux disease)   . Hyperlipidemia   . Pancreatic cystadenoma   . Pancreatitis     Past Surgical History:  Procedure Laterality Date  .  CHOLECYSTECTOMY     circa 2003 in [place], [place]  . HERNIA REPAIR    . pancreatitis surgery      circa 2003 in [place], [place]    Family History  Problem Relation Age of Onset  . Arthritis Sister   . Diabetes Mother   . Diabetes Brother     Social History:  reports that he quit smoking about 5 years ago. His smoking use included Cigars. He has never used smokeless  tobacco. He reports that he drinks about 0.6 oz of alcohol per week . He reports that he does not use drugs.  Review of Systems:   Has previous history of fatty liver with mildly abnormal liver function tests and abnormal radiological studies also as far back as 2009  Lab Results  Component Value Date   ALT 19 03/02/2017     HYPERLIPIDEMIA: The lipid abnormality consists of elevated LDL treated with Lipitor Triglycerides are improving   Lab Results  Component Value Date   CHOL 159 07/02/2017   HDL 48.00 07/02/2017   LDLCALC 80 07/02/2017   LDLDIRECT 80.0 12/31/2016   TRIG 153.0 (H) 07/02/2017   CHOLHDL 3 07/02/2017    NEUROPATHY:  He  has had symptoms of burning and sharp pains in his legs since about 2012, probably about the time of his diabetes diagnosis No symptoms of numbness in his hands although rarely may have some transient numbness in his fingertips His symptoms are usually only present at night   Since he had not been getting relief with Neurontin and later Lyrica, he has been on Cymbalta  60 mg With 60 mg his symptoms of burning and sharp pains are generally controlled and he is able to sleep  He was seen by the neurologist previously and no specific diagnosis made, surprisingly had a normal nerve conduction study   Diabetic foot exam in 7/18 shows normal monofilament sensation in the toes and plantar surfaces, no skin lesions or ulcers on the feet and normal pedal pulses    Examination:   BP 126/76   Pulse 87   Ht '5\' 6"'  (1.676 m)   Wt 150 lb 3.2 oz (68.1 kg)   SpO2 97%   BMI 24.24 kg/m   Body mass index is 24.24 kg/m.   Diabetic Foot Exam - Simple   Simple Foot Form Diabetic Foot exam was performed with the following findings:  Yes   Visual Inspection No deformities, no ulcerations, no other skin breakdown bilaterally:  Yes Sensation Testing Intact to touch and monofilament testing bilaterally:  Yes Pulse Check Posterior Tibialis and Dorsalis  pulse intact bilaterally:  Yes Comments       ASSESSMENT/ PLAN:   Diabetes type 2 with BMI 23  See history of present illness for detailed discussion of current diabetes management, blood sugar patterns and problems identified  His A1c is usually falsely low, now 5 Doing well with the combination of Amaryl, Actos and metformin ER Doing better with his exercise regimen However does need to do readings after meals which he is not doing  He will continue the same regimen Advised him to alternate sugar monitoring between fasting and after meals  LIPIDS: Triglycerides are improved, likely be from better diet Continue Lipitor as LDL is at target    Patient Instructions  Check blood sugars on waking up    Also check blood sugars about 2 hours after a meal and do this after different meals by rotation  Recommended blood sugar  levels on waking up is 90-130 and about 2 hours after meal is 130-160  Please bring your blood sugar monitor to each visit, thank you    [facility] 07/06/2017, 11:04 AM    No visits with results within 1 Day(s) from this visit.  Latest known visit with results is:  Appointment on 07/02/2017  Component Date Value Ref Range Status  . Hgb A1c MFr Bld 07/02/2017 5.0  4.6 - 6.5 % Final   Glycemic Control Guidelines for People with Diabetes:Non Diabetic:  <6%Goal of Therapy: <7%Additional Action Suggested:  >8%   . Sodium 07/02/2017 139  135 - 145 mEq/L Final  . Potassium 07/02/2017 4.1  3.5 - 5.1 mEq/L Final  . Chloride 07/02/2017 102  96 - 112 mEq/L Final  . CO2 07/02/2017 28  19 - 32 mEq/L Final  . Glucose, Bld 07/02/2017 95  70 - 99 mg/dL Final  . BUN 07/02/2017 17  6 - 23 mg/dL Final  . Creatinine, Ser 07/02/2017 0.95  0.40 - 1.50 mg/dL Final  . Calcium 07/02/2017 9.4  8.4 - 10.5 mg/dL Final  . GFR 07/02/2017 90.73  >60.00 mL/min Final  . Cholesterol 07/02/2017 159  0 - 200 mg/dL Final   ATP III Classification       Desirable:  < 200 mg/dL                Borderline High:  200 - 239 mg/dL          High:  > = 240 mg/dL  . Triglycerides 07/02/2017 153.0* 0.0 - 149.0 mg/dL Final   Normal:  <150 mg/dLBorderline High:  150 - 199 mg/dL  . HDL 07/02/2017 48.00  >39.00 mg/dL Final  . VLDL 07/02/2017 30.6  0.0 - 40.0 mg/dL Final  . LDL Cholesterol 07/02/2017 80  0 - 99 mg/dL Final  . Total CHOL/HDL Ratio 07/02/2017 3   Final                  Men          Women1/2 Average Risk     3.4          3.3Average Risk          5.0          4.42X Average Risk          9.6          7.13X Average Risk          15.0          11.0                      . NonHDL 07/02/2017 111.04   Final   NOTE:  Non-HDL goal should be 30 mg/dL higher than patient's LDL goal (i.e. LDL goal of < 70 mg/dL, would have non-HDL goal of < 100 mg/dL)

## 2017-07-06 ENCOUNTER — Ambulatory Visit (INDEPENDENT_AMBULATORY_CARE_PROVIDER_SITE_OTHER): Payer: BLUE CROSS/BLUE SHIELD | Admitting: Endocrinology

## 2017-07-06 ENCOUNTER — Encounter: Payer: Self-pay | Admitting: Endocrinology

## 2017-07-06 VITALS — BP 126/76 | HR 87 | Ht 66.0 in | Wt 150.2 lb

## 2017-07-06 DIAGNOSIS — E1140 Type 2 diabetes mellitus with diabetic neuropathy, unspecified: Secondary | ICD-10-CM

## 2017-07-06 NOTE — Patient Instructions (Signed)
Check blood sugars on waking up    Also check blood sugars about 2 hours after a meal and do this after different meals by rotation  Recommended blood sugar levels on waking up is 90-130 and about 2 hours after meal is 130-160  Please bring your blood sugar monitor to each visit, thank you  

## 2017-07-19 ENCOUNTER — Telehealth: Payer: Self-pay | Admitting: Endocrinology

## 2017-07-19 ENCOUNTER — Other Ambulatory Visit: Payer: Self-pay

## 2017-07-19 MED ORDER — ATORVASTATIN CALCIUM 40 MG PO TABS: 40.0000 mg | ORAL_TABLET | Freq: Every day | ORAL | 1 refills | Status: DC

## 2017-07-19 MED ORDER — DULOXETINE HCL 60 MG PO CPEP: 60.0000 mg | ORAL_CAPSULE | Freq: Every day | ORAL | 1 refills | Status: DC

## 2017-07-19 MED ORDER — GLUCOSE BLOOD VI STRP: ORAL_STRIP | 1 refills | Status: DC

## 2017-07-19 MED ORDER — GLIMEPIRIDE 1 MG PO TABS: ORAL_TABLET | ORAL | 1 refills | Status: DC

## 2017-07-19 MED ORDER — PANTOPRAZOLE SODIUM 40 MG PO TBEC: 40.0000 mg | DELAYED_RELEASE_TABLET | Freq: Every day | ORAL | 1 refills | Status: DC

## 2017-07-19 MED ORDER — METFORMIN HCL ER 750 MG PO TB24: ORAL_TABLET | ORAL | 1 refills | Status: DC

## 2017-07-19 MED ORDER — PIOGLITAZONE HCL 30 MG PO TABS: 30.0000 mg | ORAL_TABLET | Freq: Every day | ORAL | 1 refills | Status: DC

## 2017-07-19 NOTE — Telephone Encounter (Signed)
Routing to you °

## 2017-07-19 NOTE — Telephone Encounter (Signed)
Called patient and let him know that I have sent the 7 prescriptions to the [facility] and for him to check in the morning to make sure these have gone through for him so he can get before he goes on his cruise.

## 2017-07-19 NOTE — Telephone Encounter (Signed)
Patient called about several prescriptions needing to be filled. Patient is leaving in 4-5 days for 2 weeks.    [facility] [place] [place] [facility] [phone] (Phone) [phone] (Fax)   Please call patient and advise when filled. OK to leave message if no answer.

## 2017-12-31 ENCOUNTER — Other Ambulatory Visit: Payer: Self-pay

## 2017-12-31 MED ORDER — PANTOPRAZOLE SODIUM 40 MG PO TBEC: 40.0000 mg | DELAYED_RELEASE_TABLET | Freq: Every day | ORAL | 1 refills | Status: DC

## 2018-01-04 ENCOUNTER — Other Ambulatory Visit: Payer: BLUE CROSS/BLUE SHIELD

## 2018-01-07 ENCOUNTER — Ambulatory Visit: Payer: BLUE CROSS/BLUE SHIELD | Admitting: Endocrinology

## 2018-01-10 ENCOUNTER — Telehealth: Payer: Self-pay | Admitting: Endocrinology

## 2018-01-10 NOTE — Telephone Encounter (Signed)
[facility] stated that the [facility] was [facility] [place] [facility] about some of the pts script.  That the pharmacy hasn't received anything back,. In order to refill his scripts   Please advise.

## 2018-01-25 NOTE — Telephone Encounter (Signed)
[facility] stated [facility] and haven't heard anything from our office.  These are the only two he remembers metFORMIN (GLUCOPHAGE-XR) 750 MG 24 hr tablet [[id number]]  pantoprazole (PROTONIX) 40 MG tablet [[id number]   He is out of his medication  169 in morning  [facility] [place] [place] [facility] [employer] [facility] [phone] (Phone) [phone] (Fax)

## 2018-01-27 ENCOUNTER — Other Ambulatory Visit: Payer: Self-pay

## 2018-01-27 MED ORDER — PANTOPRAZOLE SODIUM 40 MG PO TBEC
40.0000 mg | DELAYED_RELEASE_TABLET | Freq: Every day | ORAL | 0 refills | Status: DC
Start: 2018-01-27 — End: 2018-01-28

## 2018-01-27 MED ORDER — PANTOPRAZOLE SODIUM 40 MG PO TBEC: 40.0000 mg | DELAYED_RELEASE_TABLET | Freq: Every day | ORAL | 1 refills | Status: DC

## 2018-01-27 MED ORDER — METFORMIN HCL ER 750 MG PO TB24: ORAL_TABLET | ORAL | 2 refills | Status: DC

## 2018-01-27 MED ORDER — METFORMIN HCL ER 750 MG PO TB24: ORAL_TABLET | ORAL | 0 refills | Status: DC

## 2018-01-27 NOTE — Telephone Encounter (Signed)
This has been resolved-[facility] an ordered the medications

## 2018-01-27 NOTE — Telephone Encounter (Signed)
Pt wants to know if the scripts we sent over to [facility] could be sent over to the following pharmacy. Pt would just like a 1 week supply to last him until medication gets delivered from [facility],.  Please advise     [id number] [place] [place] - [address] [place] [address]

## 2018-01-27 NOTE — Telephone Encounter (Signed)
Done

## 2018-01-27 NOTE — Telephone Encounter (Signed)
Pt is stating he is completely out of his medication for more than a month.  He is stating that [facility] for the medication and that they have not heard anything from our office..    [facility] [place] [place] [facility]

## 2018-01-27 NOTE — Telephone Encounter (Signed)
Patient stated that he was out of his medication and his b/s is running high, he would like it called in tonight please. for one week

## 2018-01-27 NOTE — Telephone Encounter (Signed)
[name] please send a 1 week prescription, needs to make appointment for follow-up with labs since he is overdue

## 2018-01-28 ENCOUNTER — Other Ambulatory Visit: Payer: Self-pay

## 2018-01-28 MED ORDER — DULOXETINE HCL 60 MG PO CPEP: 60.0000 mg | ORAL_CAPSULE | Freq: Every day | ORAL | 2 refills | Status: DC

## 2018-01-28 MED ORDER — METFORMIN HCL ER 750 MG PO TB24: ORAL_TABLET | ORAL | 2 refills | Status: DC

## 2018-01-28 MED ORDER — ATORVASTATIN CALCIUM 40 MG PO TABS: 40.0000 mg | ORAL_TABLET | Freq: Every day | ORAL | 2 refills | Status: DC

## 2018-01-28 MED ORDER — GLIMEPIRIDE 1 MG PO TABS: ORAL_TABLET | ORAL | 2 refills | Status: DC

## 2018-01-28 MED ORDER — PANTOPRAZOLE SODIUM 40 MG PO TBEC: 40.0000 mg | DELAYED_RELEASE_TABLET | Freq: Every day | ORAL | 2 refills | Status: DC

## 2018-01-28 MED ORDER — PIOGLITAZONE HCL 30 MG PO TABS: 30.0000 mg | ORAL_TABLET | Freq: Every day | ORAL | 2 refills | Status: DC

## 2018-01-31 NOTE — Telephone Encounter (Signed)
We will still need to make a follow-up appointment with him with labs

## 2018-02-08 ENCOUNTER — Other Ambulatory Visit (INDEPENDENT_AMBULATORY_CARE_PROVIDER_SITE_OTHER): Payer: BLUE CROSS/BLUE SHIELD

## 2018-02-08 DIAGNOSIS — E1140 Type 2 diabetes mellitus with diabetic neuropathy, unspecified: Secondary | ICD-10-CM | POA: Diagnosis not present

## 2018-02-08 LAB — COMPREHENSIVE METABOLIC PANEL
ALT: 39 U/L (ref 0–53)
AST: 32 U/L (ref 0–37)
Albumin: 4.3 g/dL (ref 3.5–5.2)
Alkaline Phosphatase: 92 U/L (ref 39–117)
BUN: 16 mg/dL (ref 6–23)
CO2: 30 meq/L (ref 19–32)
CREATININE: 0.73 mg/dL (ref 0.40–1.50)
Calcium: 9.2 mg/dL (ref 8.4–10.5)
Chloride: 102 mEq/L (ref 96–112)
GFR: 122.64 mL/min (ref >60.00)
GLUCOSE: 120 mg/dL — AB (ref 70–99)
Potassium: 4.1 mEq/L (ref 3.5–5.1)
Sodium: 138 mEq/L (ref 135–145)
Total Bilirubin: 0.8 mg/dL (ref 0.2–1.2)
Total Protein: 6.8 g/dL (ref 6.0–8.3)

## 2018-02-08 LAB — HEMOGLOBIN A1C: Hgb A1c MFr Bld: 5.7 % (ref 4.6–6.5)

## 2018-02-09 LAB — FRUCTOSAMINE: Fructosamine: 261 umol/L (ref 0–285)

## 2018-02-11 ENCOUNTER — Encounter: Payer: Self-pay | Admitting: Endocrinology

## 2018-02-11 ENCOUNTER — Ambulatory Visit (INDEPENDENT_AMBULATORY_CARE_PROVIDER_SITE_OTHER): Payer: BLUE CROSS/BLUE SHIELD | Admitting: Endocrinology

## 2018-02-11 VITALS — BP 132/88 | HR 85 | Ht 66.0 in | Wt 145.4 lb

## 2018-02-11 DIAGNOSIS — Z23 Encounter for immunization: Secondary | ICD-10-CM | POA: Diagnosis not present

## 2018-02-11 DIAGNOSIS — E1165 Type 2 diabetes mellitus with hyperglycemia: Secondary | ICD-10-CM

## 2018-02-11 NOTE — Patient Instructions (Signed)
Exercise  Try 90 mg Cymbalta

## 2018-02-11 NOTE — Progress Notes (Signed)
Patient ID: Cole Craig, male   DOB: 11/04/1971, 47 y.o.   MRN: [id number]    Reason for Appointment: Diabetes follow-up   History of Present Illness   Diagnosis: Type 2 DIABETES MELITUS, date of diagnosis: 2012       He has had relatively mild diabetes which has been generally well controlled with low-dose metformin and subsequently Actos added for optimal control He generally has mostly fasting hyperglycemia but also has not been checking his blood sugars after meals on most of his visits as directed  Recent history:  Oral hypoglycemic drugs: Metformin ER 1500 mg, Amaryl 1 mg at 9 pm and Actos 30 mg        Side effects from medications: None  Has had a relatively low A1c of under 6% even though his home blood sugars are relatively higher  His A1c is 5.7 %, previously 5-5.9  He thinks his higher blood sugars are from not getting his medications for a month until about 2 weeks ago   Current management, problems identified and blood sugar patterns: He has not been seen in follow-up since last summer   He has continued to check blood sugars only in the morning and not after meals as repeatedly advised  He thinks his blood sugars were higher when he was out of his medications otherwise blood sugars have been as low as 10 9 in the morning but still abnormally high up to 153  Also not clear why he has lost weight  In the morning blood sugar generally do better with taking Amaryl 1 mg  He thinks that he has had more stress than normal working hours  Has not exercised at all  He is trying to eat healthy but may not always make good choices especially when working  No hypoglycemia with Amaryl  No GI side effects with metformin currently   Monitors blood glucose:  Once a day.    Glucometer: One Touch.           Blood Glucose readings from meter download:  Recent range   FASTING MEDIAN 146 with PREVIOUS median 118 Recent blood sugar range 109-187          Physical activity: exercise: 0/7 days on treadmill or walking  Complications: are: Neuropathy       Wt Readings from Last 3 Encounters:  02/11/18 145 lb 6.4 oz (66 kg)  07/06/17 150 lb 3.2 oz (68.1 kg)  03/05/17 149 lb (67.6 kg)   Lab Results  Component Value Date   HGBA1C 5.7 02/08/2018   HGBA1C 5.0 07/02/2017   HGBA1C 5.2 03/02/2017   Lab Results  Component Value Date   MICROALBUR 1.1 03/05/2017   LDLCALC 80 07/02/2017   CREATININE 0.73 02/08/2018    Lab on 02/08/2018  Component Date Value Ref Range Status  . Sodium 02/08/2018 138  135 - 145 mEq/L Final  . Potassium 02/08/2018 4.1  3.5 - 5.1 mEq/L Final  . Chloride 02/08/2018 102  96 - 112 mEq/L Final  . CO2 02/08/2018 30  19 - 32 mEq/L Final  . Glucose, Bld 02/08/2018 120* 70 - 99 mg/dL Final  . BUN 02/08/2018 16  6 - 23 mg/dL Final  . Creatinine, Ser 02/08/2018 0.73  0.40 - 1.50 mg/dL Final  . Total Bilirubin 02/08/2018 0.8  0.2 - 1.2 mg/dL Final  . Alkaline Phosphatase 02/08/2018 92  39 - 117 U/L Final  . AST 02/08/2018 32  0 - 37 U/L Final  .  ALT 02/08/2018 39  0 - 53 U/L Final  . Total Protein 02/08/2018 6.8  6.0 - 8.3 g/dL Final  . Albumin 02/08/2018 4.3  3.5 - 5.2 g/dL Final  . Calcium 02/08/2018 9.2  8.4 - 10.5 mg/dL Final  . GFR 02/08/2018 122.64  >60.00 mL/min Final  . Fructosamine 02/08/2018 261  0 - 285 umol/L Final   Comment: Published reference interval for apparently healthy subjects between age 51 and 84 is 44 - 285 umol/L and in a poorly controlled diabetic population is 228 - 563 umol/L with a mean of 396 umol/L.   Marland Kitchen Hgb A1c MFr Bld 02/08/2018 5.7  4.6 - 6.5 % Final   Glycemic Control Guidelines for People with Diabetes:Non Diabetic:  <6%Goal of Therapy: <7%Additional Action Suggested:  >8%     Allergies as of 02/11/2018      Reactions   Iohexol     Code: RASH, Desc: RASH AND TONGUE SWELLING   Ivp Dye [iodinated Diagnostic Agents]       Medication List        Accurate as of 02/11/18  11:59 PM. Always use your most recent med list.          atorvastatin 40 MG tablet Commonly known as:  LIPITOR Take 1 tablet (40 mg total) by mouth daily.   DULoxetine 60 MG capsule Commonly known as:  CYMBALTA Take 1 capsule (60 mg total) by mouth at bedtime.   glimepiride 1 MG tablet Commonly known as:  AMARYL 1 tab at dinner daily   glucose blood test strip Commonly known as:  ONE TOUCH ULTRA TEST Use as instructed to check blood sugars 3 times per day Dx code E11.9   Melatonin 10 MG Caps Take by mouth.   metFORMIN 750 MG 24 hr tablet Commonly known as:  GLUCOPHAGE-XR TAKE 2 TABLETS (1,500MG    TOTAL) DAILY WITH BREAKFAST   ONE TOUCH ULTRA 2 w/Device Kit Use to check blood sugar 3 times per day dx code 250.00   pantoprazole 40 MG tablet Commonly known as:  PROTONIX Take 1 tablet (40 mg total) by mouth daily.   pioglitazone 30 MG tablet Commonly known as:  ACTOS Take 1 tablet (30 mg total) by mouth daily.   Vitamin D3 2000 units Tabs Take 2,000 Units by mouth 2 (two) times daily.   ZENPEP 20000-68000 units Cpep Generic drug:  Pancrelipase (Lip-Prot-Amyl) Take 25,000 Units by mouth.       Allergies:  Allergies  Allergen Reactions  . Iohexol      Code: RASH, Desc: RASH AND TONGUE SWELLING   . Ivp Dye [Iodinated Diagnostic Agents]     Past Medical History:  Diagnosis Date  . Diabetes mellitus   . GERD (gastroesophageal reflux disease)   . Hyperlipidemia   . Pancreatic cystadenoma   . Pancreatitis     Past Surgical History:  Procedure Laterality Date  . CHOLECYSTECTOMY     circa 2003 in [place], [place]  . HERNIA REPAIR    . pancreatitis surgery      circa 2003 in [place], [place]    Family History  Problem Relation Age of Onset  . Arthritis Sister   . Diabetes Mother   . Diabetes Brother     Social History:  reports that he quit smoking about 6 years ago. His smoking use included cigars. he has never used smokeless tobacco. He reports  that he drinks about 0.6 oz of alcohol per week. He reports that he does  not use drugs.  Review of Systems:   Has previous history of fatty liver with mildly abnormal liver function tests and abnormal radiological studies also as far back as 2009  Lab Results  Component Value Date   ALT 39 02/08/2018     HYPERLIPIDEMIA: The lipid abnormality consists of elevated LDL treated with Lipitor Triglycerides are as follows   Lab Results  Component Value Date   CHOL 159 07/02/2017   HDL 48.00 07/02/2017   LDLCALC 80 07/02/2017   LDLDIRECT 80.0 12/31/2016   TRIG 153.0 (H) 07/02/2017   CHOLHDL 3 07/02/2017    NEUROPATHY:  He  has had symptoms of burning and sharp pains in his legs since about 2012, probably about the time of his diabetes diagnosis No symptoms of numbness in his hands He was seen by the neurologist previously and no specific diagnosis made, surprisingly had a normal nerve conduction study  His symptoms are usually only present at night   Since he had not been getting relief with Neurontin and later Lyrica, he has been on Cymbalta  60 mg  With 60 mg his symptoms of burning and sharp pains are better but he still has morning and some tingling early morning or at bedtime although he is able to sleep    Diabetic foot exam in 7/18 shows normal monofilament sensation in the toes and plantar surfaces, no skin lesions or ulcers on the feet and normal pedal pulses    Examination:   BP 132/88 (BP Location: Left Arm, Patient Position: Sitting, Cuff Size: Normal)   Pulse 85   Ht '5\' 6"'  (1.676 m)   Wt 145 lb 6.4 oz (66 kg)   SpO2 96%   BMI 23.47 kg/m   Body mass index is 23.47 kg/m.   ASSESSMENT/ PLAN:   Diabetes type 2 with BMI 23  See history of present illness for detailed discussion of current diabetes management, blood sugar patterns and problems identified  His A1c is usually lower than expected and still only 5.7 despite some high readings last month  He tends  to have high fasting readings although not clear if he has high readings after meals also Fructosamine of 261 indicates recently better controlled with resuming his medications consistently However can do better with exercise and he thinks he can resume  Again reminded him to check blood sugars after meals and he can take his evening reading at about 9 PM when he takes his Amaryl  NEUROPATHY: Since he has continued burning symptoms overnight he can try another 30 mg of Cymbalta  LIPIDS: Follow-up needed t    Patient Instructions  Exercise  Try 90 mg Cymbalta   Elayne Snare 02/12/2018, 4:27 PM    No visits with results within 1 Day(s) from this visit.  Latest known visit with results is:  Lab on 02/08/2018  Component Date Value Ref Range Status  . Sodium 02/08/2018 138  135 - 145 mEq/L Final  . Potassium 02/08/2018 4.1  3.5 - 5.1 mEq/L Final  . Chloride 02/08/2018 102  96 - 112 mEq/L Final  . CO2 02/08/2018 30  19 - 32 mEq/L Final  . Glucose, Bld 02/08/2018 120* 70 - 99 mg/dL Final  . BUN 02/08/2018 16  6 - 23 mg/dL Final  . Creatinine, Ser 02/08/2018 0.73  0.40 - 1.50 mg/dL Final  . Total Bilirubin 02/08/2018 0.8  0.2 - 1.2 mg/dL Final  . Alkaline Phosphatase 02/08/2018 92  39 - 117 U/L Final  . AST  02/08/2018 32  0 - 37 U/L Final  . ALT 02/08/2018 39  0 - 53 U/L Final  . Total Protein 02/08/2018 6.8  6.0 - 8.3 g/dL Final  . Albumin 02/08/2018 4.3  3.5 - 5.2 g/dL Final  . Calcium 02/08/2018 9.2  8.4 - 10.5 mg/dL Final  . GFR 02/08/2018 122.64  >60.00 mL/min Final  . Fructosamine 02/08/2018 261  0 - 285 umol/L Final   Comment: Published reference interval for apparently healthy subjects between age 34 and 30 is 60 - 285 umol/L and in a poorly controlled diabetic population is 228 - 563 umol/L with a mean of 396 umol/L.   Marland Kitchen Hgb A1c MFr Bld 02/08/2018 5.7  4.6 - 6.5 % Final   Glycemic Control Guidelines for People with Diabetes:Non Diabetic:  <6%Goal of Therapy:  <7%Additional Action Suggested:  >8%

## 2018-02-12 ENCOUNTER — Encounter: Payer: Self-pay | Admitting: Endocrinology

## 2018-02-12 MED ORDER — DULOXETINE HCL 30 MG PO CPEP: 30.0000 mg | ORAL_CAPSULE | Freq: Every day | ORAL | 3 refills | Status: DC

## 2018-03-28 ENCOUNTER — Telehealth: Payer: Self-pay

## 2018-03-28 NOTE — Telephone Encounter (Signed)
Patient is requesting a refill on Atorvastatin 40mg  tablets. Would you like to refill? If so how many?

## 2018-03-29 NOTE — Telephone Encounter (Signed)
Spoke to patient he stated he would call his PCP for medication.

## 2018-03-29 NOTE — Telephone Encounter (Signed)
Can you please ask him to get in touch with his PCP

## 2018-04-23 ENCOUNTER — Other Ambulatory Visit: Payer: Self-pay | Admitting: Endocrinology

## 2018-05-24 DIAGNOSIS — M25562 Pain in left knee: Secondary | ICD-10-CM | POA: Diagnosis not present

## 2018-05-24 DIAGNOSIS — E119 Type 2 diabetes mellitus without complications: Secondary | ICD-10-CM | POA: Diagnosis not present

## 2018-05-24 DIAGNOSIS — E1140 Type 2 diabetes mellitus with diabetic neuropathy, unspecified: Secondary | ICD-10-CM | POA: Diagnosis not present

## 2018-05-30 DIAGNOSIS — S83511A Sprain of anterior cruciate ligament of right knee, initial encounter: Secondary | ICD-10-CM | POA: Diagnosis not present

## 2018-05-30 DIAGNOSIS — M25562 Pain in left knee: Secondary | ICD-10-CM | POA: Diagnosis not present

## 2018-06-14 ENCOUNTER — Other Ambulatory Visit (INDEPENDENT_AMBULATORY_CARE_PROVIDER_SITE_OTHER): Payer: BLUE CROSS/BLUE SHIELD

## 2018-06-14 DIAGNOSIS — E1165 Type 2 diabetes mellitus with hyperglycemia: Secondary | ICD-10-CM

## 2018-06-14 LAB — LIPID PANEL
CHOL/HDL RATIO: 4
Cholesterol: 178 mg/dL (ref 0–200)
HDL: 48.7 mg/dL (ref >39.00)
NONHDL: 129.43
TRIGLYCERIDES: 213 mg/dL — AB (ref 0.0–149.0)
VLDL: 42.6 mg/dL — AB (ref 0.0–40.0)

## 2018-06-14 LAB — COMPREHENSIVE METABOLIC PANEL
ALBUMIN: 4.5 g/dL (ref 3.5–5.2)
ALK PHOS: 78 U/L (ref 39–117)
ALT: 29 U/L (ref 0–53)
AST: 26 U/L (ref 0–37)
BILIRUBIN TOTAL: 0.7 mg/dL (ref 0.2–1.2)
BUN: 16 mg/dL (ref 6–23)
CALCIUM: 9.5 mg/dL (ref 8.4–10.5)
CO2: 29 mEq/L (ref 19–32)
Chloride: 101 mEq/L (ref 96–112)
Creatinine, Ser: 0.82 mg/dL (ref 0.40–1.50)
GFR: 107.08 mL/min (ref >60.00)
GLUCOSE: 112 mg/dL — AB (ref 70–99)
POTASSIUM: 4.1 meq/L (ref 3.5–5.1)
Sodium: 138 mEq/L (ref 135–145)
Total Protein: 7 g/dL (ref 6.0–8.3)

## 2018-06-14 LAB — MICROALBUMIN / CREATININE URINE RATIO
Creatinine,U: 190.2 mg/dL
MICROALB/CREAT RATIO: 0.9 mg/g (ref 0.0–30.0)
Microalb, Ur: 1.8 mg/dL (ref 0.0–1.9)

## 2018-06-14 LAB — HEMOGLOBIN A1C: HEMOGLOBIN A1C: 5.5 % (ref 4.6–6.5)

## 2018-06-14 LAB — LDL CHOLESTEROL, DIRECT: LDL DIRECT: 94 mg/dL

## 2018-06-20 NOTE — Progress Notes (Deleted)
Patient ID: Cole Craig, male   DOB: 1971-06-18, 47 y.o.   MRN: [id number]    Reason for Appointment: Diabetes follow-up   History of Present Illness   Diagnosis: Type 2 DIABETES MELITUS, date of diagnosis: 2012       He has had relatively mild diabetes which has been generally well controlled with low-dose metformin and subsequently Actos added for optimal control He generally has mostly fasting hyperglycemia but also has not been checking his blood sugars after meals on most of his visits as directed  Recent history:  Oral hypoglycemic drugs: Metformin ER 1500 mg, Amaryl 1 mg at 9 pm and Actos 30 mg        Side effects from medications: None  Has had a relatively low A1c of under 6% even though his home blood sugars are relatively higher  His A1c is 5.7 %, previously 5-5.9  He thinks his higher blood sugars are from not getting his medications for a month until about 2 weeks ago   Current management, problems identified and blood sugar patterns: He has not been seen in follow-up since last summer   He has continued to check blood sugars only in the morning and not after meals as repeatedly advised  He thinks his blood sugars were higher when he was out of his medications otherwise blood sugars have been as low as 10 9 in the morning but still abnormally high up to 153  Also not clear why he has lost weight  In the morning blood sugar generally do better with taking Amaryl 1 mg  He thinks that he has had more stress than normal working hours  Has not exercised at all  He is trying to eat healthy but may not always make good choices especially when working  No hypoglycemia with Amaryl  No GI side effects with metformin currently   Monitors blood glucose:  Once a day.    Glucometer: One Touch.           Blood Glucose readings from meter download:  Recent range   FASTING MEDIAN 146 with PREVIOUS median 118 Recent blood sugar range 109-187          Physical activity: exercise: 0/7 days on treadmill or walking  Complications: are: Neuropathy       Wt Readings from Last 3 Encounters:  02/11/18 145 lb 6.4 oz (66 kg)  07/06/17 150 lb 3.2 oz (68.1 kg)  03/05/17 149 lb (67.6 kg)   Lab Results  Component Value Date   HGBA1C 5.5 06/14/2018   HGBA1C 5.7 02/08/2018   HGBA1C 5.0 07/02/2017   Lab Results  Component Value Date   MICROALBUR 1.8 06/14/2018   LDLCALC 80 07/02/2017   CREATININE 0.82 06/14/2018    No visits with results within 1 Week(s) from this visit.  Latest known visit with results is:  Lab on 06/14/2018  Component Date Value Ref Range Status  . Cholesterol 06/14/2018 178  0 - 200 mg/dL Final   ATP III Classification       Desirable:  < 200 mg/dL               Borderline High:  200 - 239 mg/dL          High:  > = 240 mg/dL  . Triglycerides 06/14/2018 213.0* 0.0 - 149.0 mg/dL Final   Normal:  <150 mg/dLBorderline High:  150 - 199 mg/dL  . HDL 06/14/2018 48.70  >39.00 mg/dL Final  .  VLDL 06/14/2018 42.6* 0.0 - 40.0 mg/dL Final  . Total CHOL/HDL Ratio 06/14/2018 4   Final                  Men          Women1/2 Average Risk     3.4          3.3Average Risk          5.0          4.42X Average Risk          9.6          7.13X Average Risk          15.0          11.0                      . NonHDL 06/14/2018 129.43   Final   NOTE:  Non-HDL goal should be 30 mg/dL higher than patient's LDL goal (i.e. LDL goal of < 70 mg/dL, would have non-HDL goal of < 100 mg/dL)  . Microalb, Ur 06/14/2018 1.8  0.0 - 1.9 mg/dL Final  . Creatinine,U 06/14/2018 190.2  mg/dL Final  . Microalb Creat Ratio 06/14/2018 0.9  0.0 - 30.0 mg/g Final  . Sodium 06/14/2018 138  135 - 145 mEq/L Final  . Potassium 06/14/2018 4.1  3.5 - 5.1 mEq/L Final  . Chloride 06/14/2018 101  96 - 112 mEq/L Final  . CO2 06/14/2018 29  19 - 32 mEq/L Final  . Glucose, Bld 06/14/2018 112* 70 - 99 mg/dL Final  . BUN 06/14/2018 16  6 - 23 mg/dL Final  . Creatinine, Ser  06/14/2018 0.82  0.40 - 1.50 mg/dL Final  . Total Bilirubin 06/14/2018 0.7  0.2 - 1.2 mg/dL Final  . Alkaline Phosphatase 06/14/2018 78  39 - 117 U/L Final  . AST 06/14/2018 26  0 - 37 U/L Final  . ALT 06/14/2018 29  0 - 53 U/L Final  . Total Protein 06/14/2018 7.0  6.0 - 8.3 g/dL Final  . Albumin 06/14/2018 4.5  3.5 - 5.2 g/dL Final  . Calcium 06/14/2018 9.5  8.4 - 10.5 mg/dL Final  . GFR 06/14/2018 107.08  >60.00 mL/min Final  . Hgb A1c MFr Bld 06/14/2018 5.5  4.6 - 6.5 % Final   Glycemic Control Guidelines for People with Diabetes:Non Diabetic:  <6%Goal of Therapy: <7%Additional Action Suggested:  >8%   . Direct LDL 06/14/2018 94.0  mg/dL Final   Optimal:  <100 mg/dLNear or Above Optimal:  100-129 mg/dLBorderline High:  130-159 mg/dLHigh:  160-189 mg/dLVery High:  >190 mg/dL    Allergies as of 06/21/2018      Reactions   Iohexol     Code: RASH, Desc: RASH AND TONGUE SWELLING   Ivp Dye [iodinated Diagnostic Agents]       Medication List        Accurate as of 06/20/18  9:17 PM. Always use your most recent med list.          atorvastatin 40 MG tablet Commonly known as:  LIPITOR Take 1 tablet (40 mg total) by mouth daily.   DULoxetine 60 MG capsule Commonly known as:  CYMBALTA TAKE 1 CAPSULE AT BEDTIME   glimepiride 1 MG tablet Commonly known as:  AMARYL 1 tab at dinner daily   glucose blood test strip Commonly known as:  ONE TOUCH ULTRA TEST Use as instructed to check blood sugars 3 times per day  Dx code E11.9   Melatonin 10 MG Caps Take by mouth.   metFORMIN 750 MG 24 hr tablet Commonly known as:  GLUCOPHAGE-XR TAKE 2 TABLETS (1,500MG    TOTAL) DAILY WITH BREAKFAST   ONE TOUCH ULTRA 2 w/Device Kit Use to check blood sugar 3 times per day dx code 250.00   pantoprazole 40 MG tablet Commonly known as:  PROTONIX Take 1 tablet (40 mg total) by mouth daily.   pioglitazone 30 MG tablet Commonly known as:  ACTOS Take 1 tablet (30 mg total) by mouth daily.     Vitamin D3 2000 units Tabs Take 2,000 Units by mouth 2 (two) times daily.   ZENPEP 20000-68000 units Cpep Generic drug:  Pancrelipase (Lip-Prot-Amyl) Take 25,000 Units by mouth.       Allergies:  Allergies  Allergen Reactions  . Iohexol      Code: RASH, Desc: RASH AND TONGUE SWELLING   . Ivp Dye [Iodinated Diagnostic Agents]     Past Medical History:  Diagnosis Date  . Diabetes mellitus   . GERD (gastroesophageal reflux disease)   . Hyperlipidemia   . Pancreatic cystadenoma   . Pancreatitis     Past Surgical History:  Procedure Laterality Date  . CHOLECYSTECTOMY     circa 2003 in [place], [place]  . HERNIA REPAIR    . pancreatitis surgery      circa 2003 in [place], [place]    Family History  Problem Relation Age of Onset  . Arthritis Sister   . Diabetes Mother   . Diabetes Brother     Social History:  reports that he quit smoking about 6 years ago. His smoking use included cigars. He has never used smokeless tobacco. He reports that he drinks about 0.6 oz of alcohol per week. He reports that he does not use drugs.  Review of Systems:   Has previous history of fatty liver with mildly abnormal liver function tests and abnormal radiological studies also as far back as 2009  Lab Results  Component Value Date   ALT 29 06/14/2018     HYPERLIPIDEMIA: The lipid abnormality consists of elevated LDL treated with Lipitor Triglycerides are as follows   Lab Results  Component Value Date   CHOL 178 06/14/2018   HDL 48.70 06/14/2018   LDLCALC 80 07/02/2017   LDLDIRECT 94.0 06/14/2018   TRIG 213.0 (H) 06/14/2018   CHOLHDL 4 06/14/2018    NEUROPATHY:  He  has had symptoms of burning and sharp pains in his legs since about 2012, probably about the time of his diabetes diagnosis No symptoms of numbness in his hands He was seen by the neurologist previously and no specific diagnosis made, surprisingly had a normal nerve conduction study  His symptoms are  usually only present at night   Since he had not been getting relief with Neurontin and later Lyrica, he has been on Cymbalta  60 mg  With 60 mg his symptoms of burning and sharp pains are better but he still has morning and some tingling early morning or at bedtime although he is able to sleep    Diabetic foot exam in 7/18 shows normal monofilament sensation in the toes and plantar surfaces, no skin lesions or ulcers on the feet and normal pedal pulses    Examination:   There were no vitals taken for this visit.  There is no height or weight on file to calculate BMI.   ASSESSMENT/ PLAN:   Diabetes type 2 with BMI  23  See history of present illness for detailed discussion of current diabetes management, blood sugar patterns and problems identified  His A1c is usually lower than expected and still only 5.7 despite some high readings last month  He tends to have high fasting readings although not clear if he has high readings after meals also Fructosamine of 261 indicates recently better controlled with resuming his medications consistently However can do better with exercise and he thinks he can resume  Again reminded him to check blood sugars after meals and he can take his evening reading at about 9 PM when he takes his Amaryl  NEUROPATHY: Since he has continued burning symptoms overnight he can try another 30 mg of Cymbalta  LIPIDS: Follow-up needed t    There are no Patient Instructions on file for this visit.  Elayne Snare 06/20/2018, 9:17 PM    No visits with results within 1 Day(s) from this visit.  Latest known visit with results is:  Lab on 06/14/2018  Component Date Value Ref Range Status  . Cholesterol 06/14/2018 178  0 - 200 mg/dL Final   ATP III Classification       Desirable:  < 200 mg/dL               Borderline High:  200 - 239 mg/dL          High:  > = 240 mg/dL  . Triglycerides 06/14/2018 213.0* 0.0 - 149.0 mg/dL Final   Normal:  <150 mg/dLBorderline High:   150 - 199 mg/dL  . HDL 06/14/2018 48.70  >39.00 mg/dL Final  . VLDL 06/14/2018 42.6* 0.0 - 40.0 mg/dL Final  . Total CHOL/HDL Ratio 06/14/2018 4   Final                  Men          Women1/2 Average Risk     3.4          3.3Average Risk          5.0          4.42X Average Risk          9.6          7.13X Average Risk          15.0          11.0                      . NonHDL 06/14/2018 129.43   Final   NOTE:  Non-HDL goal should be 30 mg/dL higher than patient's LDL goal (i.e. LDL goal of < 70 mg/dL, would have non-HDL goal of < 100 mg/dL)  . Microalb, Ur 06/14/2018 1.8  0.0 - 1.9 mg/dL Final  . Creatinine,U 06/14/2018 190.2  mg/dL Final  . Microalb Creat Ratio 06/14/2018 0.9  0.0 - 30.0 mg/g Final  . Sodium 06/14/2018 138  135 - 145 mEq/L Final  . Potassium 06/14/2018 4.1  3.5 - 5.1 mEq/L Final  . Chloride 06/14/2018 101  96 - 112 mEq/L Final  . CO2 06/14/2018 29  19 - 32 mEq/L Final  . Glucose, Bld 06/14/2018 112* 70 - 99 mg/dL Final  . BUN 06/14/2018 16  6 - 23 mg/dL Final  . Creatinine, Ser 06/14/2018 0.82  0.40 - 1.50 mg/dL Final  . Total Bilirubin 06/14/2018 0.7  0.2 - 1.2 mg/dL Final  . Alkaline Phosphatase 06/14/2018 78  39 - 117 U/L Final  . AST  06/14/2018 26  0 - 37 U/L Final  . ALT 06/14/2018 29  0 - 53 U/L Final  . Total Protein 06/14/2018 7.0  6.0 - 8.3 g/dL Final  . Albumin 06/14/2018 4.5  3.5 - 5.2 g/dL Final  . Calcium 06/14/2018 9.5  8.4 - 10.5 mg/dL Final  . GFR 06/14/2018 107.08  >60.00 mL/min Final  . Hgb A1c MFr Bld 06/14/2018 5.5  4.6 - 6.5 % Final   Glycemic Control Guidelines for People with Diabetes:Non Diabetic:  <6%Goal of Therapy: <7%Additional Action Suggested:  >8%   . Direct LDL 06/14/2018 94.0  mg/dL Final   Optimal:  <100 mg/dLNear or Above Optimal:  100-129 mg/dLBorderline High:  130-159 mg/dLHigh:  160-189 mg/dLVery High:  >190 mg/dL

## 2018-06-21 ENCOUNTER — Ambulatory Visit: Payer: BLUE CROSS/BLUE SHIELD | Admitting: Endocrinology

## 2018-06-23 ENCOUNTER — Encounter: Payer: Self-pay | Admitting: Endocrinology

## 2018-06-23 ENCOUNTER — Ambulatory Visit (INDEPENDENT_AMBULATORY_CARE_PROVIDER_SITE_OTHER): Payer: BLUE CROSS/BLUE SHIELD | Admitting: Endocrinology

## 2018-06-23 VITALS — BP 108/68 | HR 100 | Ht 66.0 in | Wt 147.6 lb

## 2018-06-23 DIAGNOSIS — E1140 Type 2 diabetes mellitus with diabetic neuropathy, unspecified: Secondary | ICD-10-CM | POA: Diagnosis not present

## 2018-06-23 DIAGNOSIS — E782 Mixed hyperlipidemia: Secondary | ICD-10-CM

## 2018-06-23 NOTE — Patient Instructions (Signed)
Check at bedtime Exercise

## 2018-06-23 NOTE — Progress Notes (Signed)
Patient ID: [place], male   DOB: March 25, 1971, 47 y.o.   MRN: [id number]    Reason for Appointment: Diabetes follow-up   History of Present Illness   Diagnosis: Type 2 DIABETES MELITUS, date of diagnosis: 2012       He has had relatively mild diabetes which has been generally well controlled with low-dose metformin and subsequently Actos added for optimal control He generally has mostly fasting hyperglycemia but also has not been checking his blood sugars after meals on most of his visits as directed  Recent history:  Oral hypoglycemic drugs: Metformin ER 1500 mg, Amaryl 1 mg at 9 pm and Actos 30 mg          Has has had a relatively low A1c of under 6% fairly consistently  His A1c is 5.5 %, previously 5-5.9   Current management, problems identified and blood sugar patterns:    He has not brought his blood sugar monitor for download today  However he thinks his morning sugars are better than before and not over about 125  Lab glucose was 112  This is despite his not finding the time to exercising  Also he thinks that he is getting more carbohydrate like rice in his diet  Only rarely when he has been late for lunch that he will feel a little weak or shaky   Side effects from medications: None  Monitors blood glucose:  Once a day.    Glucometer: One Touch.           Blood Glucose readings from recall:  Recent range   97-125 fasting        Physical activity: exercise: None except some gardening  Complications: are: Neuropathy       Wt Readings from Last 3 Encounters:  06/23/18 147 lb 9.6 oz (67 kg)  02/11/18 145 lb 6.4 oz (66 kg)  07/06/17 150 lb 3.2 oz (68.1 kg)   Lab Results  Component Value Date   HGBA1C 5.5 06/14/2018   HGBA1C 5.7 02/08/2018   HGBA1C 5.0 07/02/2017   Lab Results  Component Value Date   MICROALBUR 1.8 06/14/2018   LDLCALC 80 07/02/2017   CREATININE 0.82 06/14/2018    No visits with results within 1 Week(s) from  this visit.  Latest known visit with results is:  Lab on 06/14/2018  Component Date Value Ref Range Status  . Cholesterol 06/14/2018 178  0 - 200 mg/dL Final   ATP III Classification       Desirable:  < 200 mg/dL               Borderline High:  200 - 239 mg/dL          High:  > = 240 mg/dL  . Triglycerides 06/14/2018 213.0* 0.0 - 149.0 mg/dL Final   Normal:  <150 mg/dLBorderline High:  150 - 199 mg/dL  . HDL 06/14/2018 48.70  >39.00 mg/dL Final  . VLDL 06/14/2018 42.6* 0.0 - 40.0 mg/dL Final  . Total CHOL/HDL Ratio 06/14/2018 4   Final                  Men          Women1/2 Average Risk     3.4          3.3Average Risk          5.0          4.42X Average Risk  9.6          7.13X Average Risk          15.0          11.0                      . NonHDL 06/14/2018 129.43   Final   NOTE:  Non-HDL goal should be 30 mg/dL higher than patient's LDL goal (i.e. LDL goal of < 70 mg/dL, would have non-HDL goal of < 100 mg/dL)  . Microalb, Ur 06/14/2018 1.8  0.0 - 1.9 mg/dL Final  . Creatinine,U 06/14/2018 190.2  mg/dL Final  . Microalb Creat Ratio 06/14/2018 0.9  0.0 - 30.0 mg/g Final  . Sodium 06/14/2018 138  135 - 145 mEq/L Final  . Potassium 06/14/2018 4.1  3.5 - 5.1 mEq/L Final  . Chloride 06/14/2018 101  96 - 112 mEq/L Final  . CO2 06/14/2018 29  19 - 32 mEq/L Final  . Glucose, Bld 06/14/2018 112* 70 - 99 mg/dL Final  . BUN 06/14/2018 16  6 - 23 mg/dL Final  . Creatinine, Ser 06/14/2018 0.82  0.40 - 1.50 mg/dL Final  . Total Bilirubin 06/14/2018 0.7  0.2 - 1.2 mg/dL Final  . Alkaline Phosphatase 06/14/2018 78  39 - 117 U/L Final  . AST 06/14/2018 26  0 - 37 U/L Final  . ALT 06/14/2018 29  0 - 53 U/L Final  . Total Protein 06/14/2018 7.0  6.0 - 8.3 g/dL Final  . Albumin 06/14/2018 4.5  3.5 - 5.2 g/dL Final  . Calcium 06/14/2018 9.5  8.4 - 10.5 mg/dL Final  . GFR 06/14/2018 107.08  >60.00 mL/min Final  . Hgb A1c MFr Bld 06/14/2018 5.5  4.6 - 6.5 % Final   Glycemic Control Guidelines  for People with Diabetes:Non Diabetic:  <6%Goal of Therapy: <7%Additional Action Suggested:  >8%   . Direct LDL 06/14/2018 94.0  mg/dL Final   Optimal:  <100 mg/dLNear or Above Optimal:  100-129 mg/dLBorderline High:  130-159 mg/dLHigh:  160-189 mg/dLVery High:  >190 mg/dL    Allergies as of 06/23/2018      Reactions   Iohexol     Code: RASH, Desc: RASH AND TONGUE SWELLING   Ivp Dye [iodinated Diagnostic Agents]       Medication List        Accurate as of 06/23/18  3:45 PM. Always use your most recent med list.          atorvastatin 40 MG tablet Commonly known as:  LIPITOR Take 1 tablet (40 mg total) by mouth daily.   DULoxetine 60 MG capsule Commonly known as:  CYMBALTA TAKE 1 CAPSULE AT BEDTIME   glimepiride 1 MG tablet Commonly known as:  AMARYL 1 tab at dinner daily   glucose blood test strip Commonly known as:  ONE TOUCH ULTRA TEST Use as instructed to check blood sugars 3 times per day Dx code E11.9   Melatonin 10 MG Caps Take by mouth.   metFORMIN 750 MG 24 hr tablet Commonly known as:  GLUCOPHAGE-XR TAKE 2 TABLETS (1,500MG    TOTAL) DAILY WITH BREAKFAST   ONE TOUCH ULTRA 2 w/Device Kit Use to check blood sugar 3 times per day dx code 250.00   pantoprazole 40 MG tablet Commonly known as:  PROTONIX Take 1 tablet (40 mg total) by mouth daily.   pioglitazone 30 MG tablet Commonly known as:  ACTOS Take 1 tablet (30 mg total) by mouth  daily.   Vitamin D3 2000 units Tabs Take 2,000 Units by mouth 2 (two) times daily.   ZENPEP 20000-68000 units Cpep Generic drug:  Pancrelipase (Lip-Prot-Amyl) Take 25,000 Units by mouth.       Allergies:  Allergies  Allergen Reactions  . Iohexol      Code: RASH, Desc: RASH AND TONGUE SWELLING   . Ivp Dye [Iodinated Diagnostic Agents]     Past Medical History:  Diagnosis Date  . Diabetes mellitus   . GERD (gastroesophageal reflux disease)   . Hyperlipidemia   . Pancreatic cystadenoma   . Pancreatitis      Past Surgical History:  Procedure Laterality Date  . CHOLECYSTECTOMY     circa 2003 in [place], [place]  . HERNIA REPAIR    . pancreatitis surgery      circa 2003 in [place], [place]    Family History  Problem Relation Age of Onset  . Arthritis Sister   . Diabetes Mother   . Diabetes Brother     Social History:  reports that he quit smoking about 6 years ago. His smoking use included cigars. He has never used smokeless tobacco. He reports that he drinks about 0.6 oz of alcohol per week. He reports that he does not use drugs.  Review of Systems:   Has previous history of fatty liver with mildly abnormal liver function tests and abnormal radiological studies also as far back as 2009  Lab Results  Component Value Date   ALT 29 06/14/2018     HYPERLIPIDEMIA: The lipid abnormality consists of elevated LDL treated with Lipitor Triglycerides are higher as follows He may be getting more carbohydrate in his diet, usually avoiding high fat foods   Lab Results  Component Value Date   CHOL 178 06/14/2018   HDL 48.70 06/14/2018   LDLCALC 80 07/02/2017   LDLDIRECT 94.0 06/14/2018   TRIG 213.0 (H) 06/14/2018   CHOLHDL 4 06/14/2018    NEUROPATHY:  He  has had symptoms of burning and sharp pains in his legs since about 2012, probably about the time of his diabetes diagnosis No symptoms of numbness in his hands He was seen by the neurologist previously and no specific diagnosis made, surprisingly had a normal nerve conduction study  His symptoms are usually only present at night  Since he had not been getting relief with Neurontin and later Lyrica, he has been on Cymbalta 90 mg He was given additional 30 mg of Cymbalta and this has been helping him since the change in 2/19  No recent eye exam report available: He is going to schedule for follow-up   Diabetic foot exam in 7/18 shows normal monofilament sensation in the toes and plantar surfaces, no skin lesions or ulcers on  the feet and normal pedal pulses    Examination:   BP 108/68 (BP Location: Left Arm, Patient Position: Sitting, Cuff Size: Normal)   Pulse 100   Ht '5\' 6"'  (1.676 m)   Wt 147 lb 9.6 oz (67 kg)   SpO2 97%   BMI 23.82 kg/m   Body mass index is 23.82 kg/m.   ASSESSMENT/ PLAN:   Diabetes type 2 with BMI 23   See history of present illness for detailed discussion of current diabetes management, blood sugar patterns and problems identified  His A1c is usually lower than expected and still only 5.5  He tends to have high fasting readings but these are better than before with adding Amaryl Also recently these are  improved reportedly from recall He forgets to do his blood sugar testing at night and advised him to do it randomly at bedtime He does try to eat lunch on time otherwise he may occasionally feel hypoglycemic  NEUROPATHY: Since he has relief with 90 mg total of Cymbalta he can continue this    LIPIDS: Needs to cut back on carbohydrates Since HDL is not low will not need any pharmacological treatment as yet      Patient Instructions  Check at bedtime Exercise   Elayne Snare 06/23/2018, 3:45 PM    No visits with results within 1 Day(s) from this visit.  Latest known visit with results is:  Lab on 06/14/2018  Component Date Value Ref Range Status  . Cholesterol 06/14/2018 178  0 - 200 mg/dL Final   ATP III Classification       Desirable:  < 200 mg/dL               Borderline High:  200 - 239 mg/dL          High:  > = 240 mg/dL  . Triglycerides 06/14/2018 213.0* 0.0 - 149.0 mg/dL Final   Normal:  <150 mg/dLBorderline High:  150 - 199 mg/dL  . HDL 06/14/2018 48.70  >39.00 mg/dL Final  . VLDL 06/14/2018 42.6* 0.0 - 40.0 mg/dL Final  . Total CHOL/HDL Ratio 06/14/2018 4   Final                  Men          Women1/2 Average Risk     3.4          3.3Average Risk          5.0          4.42X Average Risk          9.6          7.13X Average Risk          15.0          11.0                       . NonHDL 06/14/2018 129.43   Final   NOTE:  Non-HDL goal should be 30 mg/dL higher than patient's LDL goal (i.e. LDL goal of < 70 mg/dL, would have non-HDL goal of < 100 mg/dL)  . Microalb, Ur 06/14/2018 1.8  0.0 - 1.9 mg/dL Final  . Creatinine,U 06/14/2018 190.2  mg/dL Final  . Microalb Creat Ratio 06/14/2018 0.9  0.0 - 30.0 mg/g Final  . Sodium 06/14/2018 138  135 - 145 mEq/L Final  . Potassium 06/14/2018 4.1  3.5 - 5.1 mEq/L Final  . Chloride 06/14/2018 101  96 - 112 mEq/L Final  . CO2 06/14/2018 29  19 - 32 mEq/L Final  . Glucose, Bld 06/14/2018 112* 70 - 99 mg/dL Final  . BUN 06/14/2018 16  6 - 23 mg/dL Final  . Creatinine, Ser 06/14/2018 0.82  0.40 - 1.50 mg/dL Final  . Total Bilirubin 06/14/2018 0.7  0.2 - 1.2 mg/dL Final  . Alkaline Phosphatase 06/14/2018 78  39 - 117 U/L Final  . AST 06/14/2018 26  0 - 37 U/L Final  . ALT 06/14/2018 29  0 - 53 U/L Final  . Total Protein 06/14/2018 7.0  6.0 - 8.3 g/dL Final  . Albumin 06/14/2018 4.5  3.5 - 5.2 g/dL Final  . Calcium 06/14/2018 9.5  8.4 - 10.5 mg/dL Final  .  GFR 06/14/2018 107.08  >60.00 mL/min Final  . Hgb A1c MFr Bld 06/14/2018 5.5  4.6 - 6.5 % Final   Glycemic Control Guidelines for People with Diabetes:Non Diabetic:  <6%Goal of Therapy: <7%Additional Action Suggested:  >8%   . Direct LDL 06/14/2018 94.0  mg/dL Final   Optimal:  <100 mg/dLNear or Above Optimal:  100-129 mg/dLBorderline High:  130-159 mg/dLHigh:  160-189 mg/dLVery High:  >190 mg/dL

## 2018-07-14 DIAGNOSIS — Z90411 Acquired partial absence of pancreas: Secondary | ICD-10-CM | POA: Diagnosis not present

## 2018-07-14 DIAGNOSIS — K219 Gastro-esophageal reflux disease without esophagitis: Secondary | ICD-10-CM | POA: Diagnosis not present

## 2018-07-14 DIAGNOSIS — Z23 Encounter for immunization: Secondary | ICD-10-CM | POA: Diagnosis not present

## 2018-07-14 DIAGNOSIS — Z0000 Encounter for general adult medical examination without abnormal findings: Secondary | ICD-10-CM | POA: Diagnosis not present

## 2018-09-09 DIAGNOSIS — Z23 Encounter for immunization: Secondary | ICD-10-CM | POA: Diagnosis not present

## 2018-10-25 ENCOUNTER — Other Ambulatory Visit (INDEPENDENT_AMBULATORY_CARE_PROVIDER_SITE_OTHER): Payer: BLUE CROSS/BLUE SHIELD

## 2018-10-25 DIAGNOSIS — E1140 Type 2 diabetes mellitus with diabetic neuropathy, unspecified: Secondary | ICD-10-CM | POA: Diagnosis not present

## 2018-10-25 DIAGNOSIS — E782 Mixed hyperlipidemia: Secondary | ICD-10-CM | POA: Diagnosis not present

## 2018-10-25 LAB — BASIC METABOLIC PANEL
BUN: 12 mg/dL (ref 6–23)
CHLORIDE: 100 meq/L (ref 96–112)
CO2: 28 mEq/L (ref 19–32)
Calcium: 9.7 mg/dL (ref 8.4–10.5)
Creatinine, Ser: 0.88 mg/dL (ref 0.40–1.50)
GFR: 98.54 mL/min (ref >60.00)
Glucose, Bld: 114 mg/dL — ABNORMAL HIGH (ref 70–99)
POTASSIUM: 4.2 meq/L (ref 3.5–5.1)
Sodium: 137 mEq/L (ref 135–145)

## 2018-10-25 LAB — LDL CHOLESTEROL, DIRECT: Direct LDL: 103 mg/dL

## 2018-10-25 LAB — LIPID PANEL
CHOL/HDL RATIO: 4
CHOLESTEROL: 172 mg/dL (ref 0–200)
HDL: 46.2 mg/dL (ref >39.00)
NonHDL: 125.31
Triglycerides: 209 mg/dL — ABNORMAL HIGH (ref 0.0–149.0)
VLDL: 41.8 mg/dL — ABNORMAL HIGH (ref 0.0–40.0)

## 2018-10-25 LAB — HEMOGLOBIN A1C: HEMOGLOBIN A1C: 5.3 % (ref 4.6–6.5)

## 2018-11-03 ENCOUNTER — Ambulatory Visit (INDEPENDENT_AMBULATORY_CARE_PROVIDER_SITE_OTHER): Payer: BLUE CROSS/BLUE SHIELD | Admitting: Endocrinology

## 2018-11-03 ENCOUNTER — Encounter: Payer: Self-pay | Admitting: Endocrinology

## 2018-11-03 VITALS — BP 132/88 | HR 112 | Ht 68.0 in | Wt 152.0 lb

## 2018-11-03 DIAGNOSIS — E1165 Type 2 diabetes mellitus with hyperglycemia: Secondary | ICD-10-CM

## 2018-11-03 DIAGNOSIS — E782 Mixed hyperlipidemia: Secondary | ICD-10-CM | POA: Diagnosis not present

## 2018-11-03 DIAGNOSIS — Z23 Encounter for immunization: Secondary | ICD-10-CM | POA: Diagnosis not present

## 2018-11-03 MED ORDER — ROSUVASTATIN CALCIUM 20 MG PO TABS: 20.0000 mg | ORAL_TABLET | Freq: Every day | ORAL | 3 refills | Status: DC

## 2018-11-03 MED ORDER — GLUCOSE BLOOD VI STRP: ORAL_STRIP | 1 refills | Status: DC

## 2018-11-03 MED ORDER — GLIMEPIRIDE 1 MG PO TABS: ORAL_TABLET | ORAL | 2 refills | Status: DC

## 2018-11-03 MED ORDER — METFORMIN HCL ER 750 MG PO TB24: ORAL_TABLET | ORAL | 2 refills | Status: DC

## 2018-11-03 NOTE — Patient Instructions (Addendum)
Check blood sugars on waking up 3 days a week  Also check blood sugars about 2 hours after meals and do this after different meals by rotation  Recommended blood sugar levels on waking up are 90-130 and about 2 hours after meal is 130-160  Please bring your blood sugar monitor to each visit, thank you  Stop Lipitor, start Crestor

## 2018-11-03 NOTE — Progress Notes (Signed)
Patient ID: [place], male   DOB: 03-15-1971, 47 y.o.   MRN: [id number]    Reason for Appointment: Diabetes follow-up   History of Present Illness   Diagnosis: Type 2 DIABETES MELITUS, date of diagnosis: 2012       He has had relatively mild diabetes which has been generally well controlled with low-dose metformin and subsequently Actos added for optimal control He generally has mostly fasting hyperglycemia but also has not been checking his blood sugars after meals on most of his visits as directed  Recent history:  Oral hypoglycemic drugs: Metformin ER 1500 mg, Amaryl 1 mg at 9 pm and Actos 30 mg          Has has had a relatively low A1c of under 6% fairly consistently, lower than expected for his home readings  His A1c is 5.3 %, previously range 5-5.9   Current management, problems identified and blood sugar patterns:    He says that he is trying to do some walking in the morning but has gained weight  He thinks he is not watching his diet consistently although not eating out at restaurants excessively compared to before  Has only one reading after lunch and this was 189 around 6 PM but otherwise again forgetting to check readings after meals  FASTING blood sugars are excellent again and averaging just over 120  No hypoglycemia with Amaryl  No side effects with Actos or Metformin  Lab glucose was 114 fasting  His weight has gone up 5 pounds  Side effects from medications: None  Monitors blood glucose:  Once a day.    Glucometer: One Touch.           Blood Glucose readings from download:  Recent range FASTING 105-144  189 at 6 PM        Physical activity: exercise: He is trying to do a little walking in the mornings  Complications: are: Neuropathy       Wt Readings from Last 3 Encounters:  11/03/18 152 lb (68.9 kg)  06/23/18 147 lb 9.6 oz (67 kg)  02/11/18 145 lb 6.4 oz (66 kg)   Lab Results  Component Value Date   HGBA1C 5.3  10/25/2018   HGBA1C 5.5 06/14/2018   HGBA1C 5.7 02/08/2018   Lab Results  Component Value Date   MICROALBUR 1.8 06/14/2018   LDLCALC 80 07/02/2017   CREATININE 0.88 10/25/2018    No visits with results within 1 Week(s) from this visit.  Latest known visit with results is:  Lab on 10/25/2018  Component Date Value Ref Range Status  . Cholesterol 10/25/2018 172  0 - 200 mg/dL Final   ATP III Classification       Desirable:  < 200 mg/dL               Borderline High:  200 - 239 mg/dL          High:  > = 240 mg/dL  . Triglycerides 10/25/2018 209.0* 0.0 - 149.0 mg/dL Final   Normal:  <150 mg/dLBorderline High:  150 - 199 mg/dL  . HDL 10/25/2018 46.20  >39.00 mg/dL Final  . VLDL 10/25/2018 41.8* 0.0 - 40.0 mg/dL Final  . Total CHOL/HDL Ratio 10/25/2018 4   Final                  Men          Women1/2 Average Risk     3.4  3.3Average Risk          5.0          4.42X Average Risk          9.6          7.13X Average Risk          15.0          11.0                      . NonHDL 10/25/2018 125.31   Final   NOTE:  Non-HDL goal should be 30 mg/dL higher than patient's LDL goal (i.e. LDL goal of < 70 mg/dL, would have non-HDL goal of < 100 mg/dL)  . Sodium 10/25/2018 137  135 - 145 mEq/L Final  . Potassium 10/25/2018 4.2  3.5 - 5.1 mEq/L Final  . Chloride 10/25/2018 100  96 - 112 mEq/L Final  . CO2 10/25/2018 28  19 - 32 mEq/L Final  . Glucose, Bld 10/25/2018 114* 70 - 99 mg/dL Final  . BUN 10/25/2018 12  6 - 23 mg/dL Final  . Creatinine, Ser 10/25/2018 0.88  0.40 - 1.50 mg/dL Final  . Calcium 10/25/2018 9.7  8.4 - 10.5 mg/dL Final  . GFR 10/25/2018 98.54  >60.00 mL/min Final  . Hgb A1c MFr Bld 10/25/2018 5.3  4.6 - 6.5 % Final   Glycemic Control Guidelines for People with Diabetes:Non Diabetic:  <6%Goal of Therapy: <7%Additional Action Suggested:  >8%   . Direct LDL 10/25/2018 103.0  mg/dL Final   Optimal:  <100 mg/dLNear or Above Optimal:  100-129 mg/dLBorderline High:  130-159  mg/dLHigh:  160-189 mg/dLVery High:  >190 mg/dL    Allergies as of 11/03/2018      Reactions   Iohexol     Code: RASH, Desc: RASH AND TONGUE SWELLING   Ivp Dye [iodinated Diagnostic Agents]       Medication List        Accurate as of 11/03/18 12:06 PM. Always use your most recent med list.          atorvastatin 40 MG tablet Commonly known as:  LIPITOR Take 1 tablet (40 mg total) by mouth daily.   DULoxetine 60 MG capsule Commonly known as:  CYMBALTA TAKE 1 CAPSULE AT BEDTIME   glimepiride 1 MG tablet Commonly known as:  AMARYL 1 tab at dinner daily   glucose blood test strip Use as instructed to check blood sugars 3 times per day Dx code E11.9   Melatonin 10 MG Caps Take by mouth.   metFORMIN 750 MG 24 hr tablet Commonly known as:  GLUCOPHAGE-XR TAKE 2 TABLETS (1,500MG    TOTAL) DAILY WITH BREAKFAST   ONE TOUCH ULTRA 2 w/Device Kit Use to check blood sugar 3 times per day dx code 250.00   pantoprazole 40 MG tablet Commonly known as:  PROTONIX Take 1 tablet (40 mg total) by mouth daily.   pioglitazone 30 MG tablet Commonly known as:  ACTOS Take 1 tablet (30 mg total) by mouth daily.   Vitamin D3 50 MCG (2000 UT) Tabs Take 2,000 Units by mouth 2 (two) times daily.   ZENPEP 20000-68000 units Cpep Generic drug:  Pancrelipase (Lip-Prot-Amyl) Take 25,000 Units by mouth.       Allergies:  Allergies  Allergen Reactions  . Iohexol      Code: RASH, Desc: RASH AND TONGUE SWELLING   . Ivp Dye [Iodinated Diagnostic Agents]     Past Medical History:  Diagnosis Date  . Diabetes mellitus   . GERD (gastroesophageal reflux disease)   . Hyperlipidemia   . Pancreatic cystadenoma   . Pancreatitis     Past Surgical History:  Procedure Laterality Date  . CHOLECYSTECTOMY     circa 2003 in [place], [place]  . HERNIA REPAIR    . pancreatitis surgery      circa 2003 in [place], [place]    Family History  Problem Relation Age of Onset  . Arthritis Sister   .  Diabetes Mother   . Diabetes Brother     Social History:  reports that he quit smoking about 6 years ago. His smoking use included cigars. He has never used smokeless tobacco. He reports that he drinks about 1.0 standard drinks of alcohol per week. He reports that he does not use drugs.  Review of Systems:   Has previous history of fatty liver with mildly abnormal liver function tests and abnormal radiological studies also as far back as 2009  Lab Results  Component Value Date   ALT 29 06/14/2018     HYPERLIPIDEMIA: The lipid abnormality consists of elevated LDL treated with Lipitor Triglycerides are again above 200 as follows He may be getting more carbohydrate in his diet, usually restricting high fat foods LDL however is trending higher  Lab Results  Component Value Date   CHOL 172 10/25/2018   HDL 46.20 10/25/2018   LDLCALC 80 07/02/2017   LDLDIRECT 103.0 10/25/2018   TRIG 209.0 (H) 10/25/2018   CHOLHDL 4 10/25/2018    NEUROPATHY:  He  has had symptoms of burning and sharp pains in his legs since about 2012, probably about the time of his diabetes diagnosis No symptoms of numbness in his hands He was seen by the neurologist previously and no specific diagnosis made, surprisingly had a normal nerve conduction study  His symptoms are usually only present at night although now complaining of some sensation of heat in his feet during the day  Since he had not been getting relief with Neurontin and later Lyrica, he has been on Cymbalta 90 mg Usually has relief of symptoms with this but is trying OTC CBD cream topically on his feet which he thinks is helping also  No recent eye exam report available from 2019   Diabetic foot exam in 7/19 shows normal monofilament sensation in the toes and plantar surfaces, no skin lesions or ulcers on the feet and normal pedal pulses    Examination:   BP 132/88   Pulse (!) 112   Ht _0  (1.727 m)   Wt 152 lb (68.9 kg)   SpO2 99%    BMI 23.11 kg/m   Body mass index is 23.11 kg/m.   ASSESSMENT/ PLAN:   Diabetes type 2 with BMI 23   See history of present illness for detailed discussion of current diabetes management, blood sugar patterns and problems identified  His A1c is usually lower than expected and still only 5.3  Fasting readings are averaging about 120 However his monitoring is done only in the morning and he has not done readings after meals; only had one reading of 189 after eating out with more carbohydrate He has gained weight and we discussed that he will need to cut back on his caloric intake and increase exercise  NEUROPATHY: Using 90 mg total of Cymbalta with relief; he can continue this He says he is using CBD oil on his feet with some relief and can continue  LIPIDS: His triglycerides are still relatively high His LDL is going up despite taking 40 mg Lipitor and he is not having high fat meals We will go ahead and switch him to Crestor 20 mg for now  Influenza vaccine given   Patient Instructions  Check blood sugars on waking up 3 days a week  Also check blood sugars about 2 hours after meals and do this after different meals by rotation  Recommended blood sugar levels on waking up are 90-130 and about 2 hours after meal is 130-160  Please bring your blood sugar monitor to each visit, thank you  Stop Lipitor, start Crestor     Elayne Snare 11/03/2018, 12:06 PM    No visits with results within 1 Day(s) from this visit.  Latest known visit with results is:  Lab on 10/25/2018  Component Date Value Ref Range Status  . Cholesterol 10/25/2018 172  0 - 200 mg/dL Final   ATP III Classification       Desirable:  < 200 mg/dL               Borderline High:  200 - 239 mg/dL          High:  > = 240 mg/dL  . Triglycerides 10/25/2018 209.0* 0.0 - 149.0 mg/dL Final   Normal:  <150 mg/dLBorderline High:  150 - 199 mg/dL  . HDL 10/25/2018 46.20  >39.00 mg/dL Final  . VLDL 10/25/2018 41.8* 0.0 -  40.0 mg/dL Final  . Total CHOL/HDL Ratio 10/25/2018 4   Final                  Men          Women1/2 Average Risk     3.4          3.3Average Risk          5.0          4.42X Average Risk          9.6          7.13X Average Risk          15.0          11.0                      . NonHDL 10/25/2018 125.31   Final   NOTE:  Non-HDL goal should be 30 mg/dL higher than patient's LDL goal (i.e. LDL goal of < 70 mg/dL, would have non-HDL goal of < 100 mg/dL)  . Sodium 10/25/2018 137  135 - 145 mEq/L Final  . Potassium 10/25/2018 4.2  3.5 - 5.1 mEq/L Final  . Chloride 10/25/2018 100  96 - 112 mEq/L Final  . CO2 10/25/2018 28  19 - 32 mEq/L Final  . Glucose, Bld 10/25/2018 114* 70 - 99 mg/dL Final  . BUN 10/25/2018 12  6 - 23 mg/dL Final  . Creatinine, Ser 10/25/2018 0.88  0.40 - 1.50 mg/dL Final  . Calcium 10/25/2018 9.7  8.4 - 10.5 mg/dL Final  . GFR 10/25/2018 98.54  >60.00 mL/min Final  . Hgb A1c MFr Bld 10/25/2018 5.3  4.6 - 6.5 % Final   Glycemic Control Guidelines for People with Diabetes:Non Diabetic:  <6%Goal of Therapy: <7%Additional Action Suggested:  >8%   . Direct LDL 10/25/2018 103.0  mg/dL Final   Optimal:  <100 mg/dLNear or Above Optimal:  100-129 mg/dLBorderline High:  130-159 mg/dLHigh:  160-189 mg/dLVery High:  >190 mg/dL

## 2018-11-07 ENCOUNTER — Telehealth: Payer: Self-pay | Admitting: Endocrinology

## 2018-11-07 NOTE — Telephone Encounter (Signed)
Pharmacy is calling in regards to patients insurance not covering OneTouch. Patients insurance covers  Accu-Guide, testing strips, & lancets. Pharmacy was following up with the paperwork. Please advise, thanks Ph # [phone]  Reference # [id number]

## 2018-11-08 NOTE — Telephone Encounter (Signed)
Have you seen this paperwork please advise

## 2018-11-08 NOTE — Telephone Encounter (Signed)
Has been signed, please fax

## 2018-11-09 NOTE — Telephone Encounter (Signed)
This has been faxed.

## 2019-01-20 ENCOUNTER — Other Ambulatory Visit: Payer: Self-pay | Admitting: Endocrinology

## 2019-01-24 ENCOUNTER — Telehealth: Payer: Self-pay | Admitting: Endocrinology

## 2019-01-24 NOTE — Telephone Encounter (Signed)
Called pharmacy and provided clarification that according to our charts, patient is only on Duloxetine 60mg  1 tab by mouth QHS.

## 2019-01-24 NOTE — Telephone Encounter (Signed)
[facility] wants confirmation on which Duloetine Rx to fill the 60MG , the 30 MG, or both. They have both on file for patient.   Call back number is [phone]  Ref #[id number]

## 2019-02-22 ENCOUNTER — Other Ambulatory Visit: Payer: BLUE CROSS/BLUE SHIELD

## 2019-03-07 ENCOUNTER — Ambulatory Visit: Payer: BLUE CROSS/BLUE SHIELD | Admitting: Endocrinology

## 2019-05-12 ENCOUNTER — Other Ambulatory Visit: Payer: Self-pay | Admitting: Endocrinology

## 2019-05-17 ENCOUNTER — Telehealth: Payer: Self-pay | Admitting: Endocrinology

## 2019-05-17 NOTE — Telephone Encounter (Signed)
[facility] called in regards to having duplicate RX's on file. Needs verify correct one.  Reference # [id number] [facility] # [id number]

## 2019-05-18 ENCOUNTER — Other Ambulatory Visit: Payer: Self-pay

## 2019-05-18 MED ORDER — ROSUVASTATIN CALCIUM 20 MG PO TABS: 20.0000 mg | ORAL_TABLET | Freq: Every day | ORAL | 3 refills | Status: DC

## 2019-05-18 NOTE — Telephone Encounter (Signed)
Rx clarified

## 2019-05-24 ENCOUNTER — Other Ambulatory Visit: Payer: Self-pay | Admitting: Endocrinology

## 2019-05-24 ENCOUNTER — Encounter: Payer: Self-pay | Admitting: Endocrinology

## 2019-05-24 ENCOUNTER — Ambulatory Visit (INDEPENDENT_AMBULATORY_CARE_PROVIDER_SITE_OTHER): Payer: BLUE CROSS/BLUE SHIELD | Admitting: Endocrinology

## 2019-05-24 ENCOUNTER — Other Ambulatory Visit: Payer: Self-pay

## 2019-05-24 DIAGNOSIS — E1165 Type 2 diabetes mellitus with hyperglycemia: Secondary | ICD-10-CM

## 2019-05-24 DIAGNOSIS — E782 Mixed hyperlipidemia: Secondary | ICD-10-CM

## 2019-05-24 DIAGNOSIS — E1140 Type 2 diabetes mellitus with diabetic neuropathy, unspecified: Secondary | ICD-10-CM | POA: Diagnosis not present

## 2019-05-24 MED ORDER — GLIMEPIRIDE 1 MG PO TABS: ORAL_TABLET | ORAL | 2 refills | Status: DC

## 2019-05-24 MED ORDER — METFORMIN HCL ER 750 MG PO TB24: ORAL_TABLET | ORAL | 2 refills | Status: DC

## 2019-05-24 MED ORDER — DULOXETINE HCL 30 MG PO CPEP: 90.0000 mg | ORAL_CAPSULE | Freq: Every day | ORAL | 1 refills | Status: DC

## 2019-05-24 MED ORDER — PIOGLITAZONE HCL 30 MG PO TABS: 30.0000 mg | ORAL_TABLET | Freq: Every day | ORAL | 2 refills | Status: DC

## 2019-05-24 NOTE — Progress Notes (Signed)
Patient ID: Cole Craig, male   DOB: 10-31-1971, 48 y.o.   MRN: [id number]   Today's office visit was provided via telemedicine using video technique Explained to the patient and the the limitations of evaluation and management by telemedicine and the availability of in person appointments.  The patient understood the limitations and agreed to proceed. Patient also understood that the telehealth visit is billable. . Location of the patient: Home . Location of the provider: Office  The patient, myself and his wife were participating in the encounter  His previous records, lab results, general history was reviewed prior to the encounter   Reason for Appointment: Diabetes follow-up   History of Present Illness   Diagnosis: Type 2 DIABETES MELITUS, date of diagnosis: 2012       He has had relatively mild diabetes which has been generally well controlled with low-dose metformin and subsequently Actos added for optimal control He generally has mostly fasting hyperglycemia but also has not been checking his blood sugars after meals on most of his visits as directed  Recent history:  Oral hypoglycemic drugs: Metformin ER 1500 mg, Amaryl 1 mg at 9 pm and Actos 30 mg          Has has had a relatively low A1c of under 6% fairly consistently, lower than expected for his home readings  His A1c has not been done, previously 5.3 %, usual range 5-5.9   Current management, problems identified and blood sugar patterns:    He has not been seen in follow-up since 11/19  He ran out of his Amaryl and Actos about a week or so ago and he thinks his blood sugars are higher because of this  However even though he says that he is usually trying to do fairly well with his diet he has not done any exercise  Currently is having high fasting blood sugar readings  He thinks that before he ran out of his medication his glucose was about 110  Not clear if his weight has changed  Also  forgets to check his sugars after meals  Usually he goes to bed soon after his dinner which is usually around 9 PM  Side effects from medications: None  Monitors blood glucose:  Once a day.    Glucometer: One Touch.           Blood Glucose readings from patient recall:  Recent FASTING range 153-201  Average not available  Complications: are: Neuropathy       Wt Readings from Last 3 Encounters:  11/03/18 152 lb (68.9 kg)  06/23/18 147 lb 9.6 oz (67 kg)  02/11/18 145 lb 6.4 oz (66 kg)   Lab Results  Component Value Date   HGBA1C 5.3 10/25/2018   HGBA1C 5.5 06/14/2018   HGBA1C 5.7 02/08/2018   Lab Results  Component Value Date   MICROALBUR 1.8 06/14/2018   LDLCALC 80 07/02/2017   CREATININE 0.88 10/25/2018    No visits with results within 1 Week(s) from this visit.  Latest known visit with results is:  Lab on 10/25/2018  Component Date Value Ref Range Status  . Cholesterol 10/25/2018 172  0 - 200 mg/dL Final   ATP III Classification       Desirable:  < 200 mg/dL               Borderline High:  200 - 239 mg/dL          High:  > = 240 mg/dL  .  Triglycerides 10/25/2018 209.0* 0.0 - 149.0 mg/dL Final   Normal:  <150 mg/dLBorderline High:  150 - 199 mg/dL  . HDL 10/25/2018 46.20  >39.00 mg/dL Final  . VLDL 10/25/2018 41.8* 0.0 - 40.0 mg/dL Final  . Total CHOL/HDL Ratio 10/25/2018 4   Final                  Men          Women1/2 Average Risk     3.4          3.3Average Risk          5.0          4.42X Average Risk          9.6          7.13X Average Risk          15.0          11.0                      . NonHDL 10/25/2018 125.31   Final   NOTE:  Non-HDL goal should be 30 mg/dL higher than patient's LDL goal (i.e. LDL goal of < 70 mg/dL, would have non-HDL goal of < 100 mg/dL)  . Sodium 10/25/2018 137  135 - 145 mEq/L Final  . Potassium 10/25/2018 4.2  3.5 - 5.1 mEq/L Final  . Chloride 10/25/2018 100  96 - 112 mEq/L Final  . CO2 10/25/2018 28  19 - 32 mEq/L Final  .  Glucose, Bld 10/25/2018 114* 70 - 99 mg/dL Final  . BUN 10/25/2018 12  6 - 23 mg/dL Final  . Creatinine, Ser 10/25/2018 0.88  0.40 - 1.50 mg/dL Final  . Calcium 10/25/2018 9.7  8.4 - 10.5 mg/dL Final  . GFR 10/25/2018 98.54  >60.00 mL/min Final  . Hgb A1c MFr Bld 10/25/2018 5.3  4.6 - 6.5 % Final   Glycemic Control Guidelines for People with Diabetes:Non Diabetic:  <6%Goal of Therapy: <7%Additional Action Suggested:  >8%   . Direct LDL 10/25/2018 103.0  mg/dL Final   Optimal:  <100 mg/dLNear or Above Optimal:  100-129 mg/dLBorderline High:  130-159 mg/dLHigh:  160-189 mg/dLVery High:  >190 mg/dL    Allergies as of 05/24/2019      Reactions   Iohexol     Code: RASH, Desc: RASH AND TONGUE SWELLING   Ivp Dye [iodinated Diagnostic Agents]       Medication List       Accurate as of May 24, 2019  8:37 AM. If you have any questions, ask your nurse or doctor.        atorvastatin 40 MG tablet Commonly known as:  LIPITOR TAKE 1 TABLET DAILY   DULoxetine 60 MG capsule Commonly known as:  CYMBALTA TAKE 1 CAPSULE AT BEDTIME   glimepiride 1 MG tablet Commonly known as:  Amaryl 1 tab at dinner daily   glucose blood test strip Commonly known as:  ONE TOUCH ULTRA TEST Use as instructed to check blood sugars 3 times per day Dx code E11.9   Melatonin 10 MG Caps Take by mouth.   metFORMIN 750 MG 24 hr tablet Commonly known as:  GLUCOPHAGE-XR TAKE 2 TABLETS (1,500MG    TOTAL) DAILY WITH BREAKFAST   ONE TOUCH ULTRA 2 w/Device Kit Use to check blood sugar 3 times per day dx code 250.00   pantoprazole 40 MG tablet Commonly known as:  PROTONIX Take 1 tablet (40 mg total) by  mouth daily.   pioglitazone 30 MG tablet Commonly known as:  ACTOS Take 1 tablet (30 mg total) by mouth daily.   rosuvastatin 20 MG tablet Commonly known as:  Crestor Take 1 tablet (20 mg total) by mouth daily.   Vitamin D3 50 MCG (2000 UT) Tabs Take 2,000 Units by mouth 2 (two) times daily.   Zenpep  20000-68000 units Cpep Generic drug:  Pancrelipase (Lip-Prot-Amyl) Take 25,000 Units by mouth.       Allergies:  Allergies  Allergen Reactions  . Iohexol      Code: RASH, Desc: RASH AND TONGUE SWELLING   . Ivp Dye [Iodinated Diagnostic Agents]     Past Medical History:  Diagnosis Date  . Diabetes mellitus   . GERD (gastroesophageal reflux disease)   . Hyperlipidemia   . Pancreatic cystadenoma   . Pancreatitis     Past Surgical History:  Procedure Laterality Date  . CHOLECYSTECTOMY     circa 2003 in [place], [place]  . HERNIA REPAIR    . pancreatitis surgery      circa 2003 in [place], [place]    Family History  Problem Relation Age of Onset  . Arthritis Sister   . Diabetes Mother   . Diabetes Brother     Social History:  reports that he quit smoking about 7 years ago. His smoking use included cigars. He has never used smokeless tobacco. He reports current alcohol use of about 1.0 standard drinks of alcohol per week. He reports that he does not use drugs.  Review of Systems:   Has previous history of fatty liver with mildly abnormal liver function tests and abnormal radiological studies also as far back as 2009  Lab Results  Component Value Date   ALT 29 06/14/2018     HYPERLIPIDEMIA: The lipid abnormality consists of elevated LDL treated with Lipitor Because of his triglycerides being slightly high and LDL being higher he was switched from Lipitor to Crestor 20 mg daily No follow-up available and he is still confused about whether he is taking 1 or the other or both medications, currently has a prescription for rosuvastatin only   Lab Results  Component Value Date   CHOL 172 10/25/2018   HDL 46.20 10/25/2018   LDLCALC 80 07/02/2017   LDLDIRECT 103.0 10/25/2018   TRIG 209.0 (H) 10/25/2018   CHOLHDL 4 10/25/2018    NEUROPATHY:  He  has had symptoms of burning and sharp pains in his legs since about 2012, probably about the time of his diabetes  diagnosis No symptoms of numbness in his hands but has some burning He was seen by the neurologist previously and no specific diagnosis made, reportedly had a normal nerve conduction study  His symptoms are usually mostly present at night Since he had not been getting relief with Neurontin and later Lyrica, he has been on Cymbalta 90 mg Usually has relief of symptoms with this but recently is having more difficulties and is getting up at night with burning and discomfort Previously was trying to use  OTC CBD cream topically on his feet which may have been helping some but he and his wife thinks that he was getting nightmares and sleep talking with this and he is not doing this now   No eye exam report available from 2019   Diabetic foot exam in 7/19 shows normal monofilament sensation in the toes and plantar surfaces, no skin lesions or ulcers on the feet and normal pedal pulses  Examination:   There were no vitals taken for this visit.  There is no height or weight on file to calculate BMI.   ASSESSMENT/ PLAN:   Diabetes type 2 with normal BMI  See history of present illness for detailed discussion of current diabetes management, blood sugar patterns and problems identified  His A1c needs to be checked  Although he thinks his blood sugars have been fairly consistent until recently there is no confirmation with his meter download today His blood sugars have been higher at least for a week with not taking his Amaryl and Actos  Also again he is not planning his meals consistently with is working long hours Not exercising at all lately Also forgets to check his sugars after meals as discussed before Encouraged him to start walking in the mornings Also needs to check some readings after meals when he can He will need to let us know if his blood sugars do not improve with restarting his Amaryl and Actos  NEUROPATHY: Using 90 mg of Cymbalta without enough relief He can try adding  Lyrica 75 mg at bedtime also Also emphasized the need for exercise Consider neurology consultation or even trying amitriptyline low-dose at bedtime   LIPIDS: His labs will be rechecked fasting  Reminded him that he is only going to take Crestor and not Lipitor also  Follow-up in 3 months  Total visit time for evaluation and management of multiple problems and counseling =25 minutes  There are no Patient Instructions on file for this visit.  Elayne Snare 05/24/2019, 8:37 AM    No visits with results within 1 Day(s) from this visit.  Latest known visit with results is:  Lab on 10/25/2018  Component Date Value Ref Range Status  . Cholesterol 10/25/2018 172  0 - 200 mg/dL Final   ATP III Classification       Desirable:  < 200 mg/dL               Borderline High:  200 - 239 mg/dL          High:  > = 240 mg/dL  . Triglycerides 10/25/2018 209.0* 0.0 - 149.0 mg/dL Final   Normal:  <150 mg/dLBorderline High:  150 - 199 mg/dL  . HDL 10/25/2018 46.20  >39.00 mg/dL Final  . VLDL 10/25/2018 41.8* 0.0 - 40.0 mg/dL Final  . Total CHOL/HDL Ratio 10/25/2018 4   Final                  Men          Women1/2 Average Risk     3.4          3.3Average Risk          5.0          4.42X Average Risk          9.6          7.13X Average Risk          15.0          11.0                      . NonHDL 10/25/2018 125.31   Final   NOTE:  Non-HDL goal should be 30 mg/dL higher than patient's LDL goal (i.e. LDL goal of < 70 mg/dL, would have non-HDL goal of < 100 mg/dL)  . Sodium 10/25/2018 137  135 - 145 mEq/L Final  . Potassium 10/25/2018 4.2  3.5 -  5.1 mEq/L Final  . Chloride 10/25/2018 100  96 - 112 mEq/L Final  . CO2 10/25/2018 28  19 - 32 mEq/L Final  . Glucose, Bld 10/25/2018 114* 70 - 99 mg/dL Final  . BUN 10/25/2018 12  6 - 23 mg/dL Final  . Creatinine, Ser 10/25/2018 0.88  0.40 - 1.50 mg/dL Final  . Calcium 10/25/2018 9.7  8.4 - 10.5 mg/dL Final  . GFR 10/25/2018 98.54  >60.00 mL/min Final  . Hgb A1c  MFr Bld 10/25/2018 5.3  4.6 - 6.5 % Final   Glycemic Control Guidelines for People with Diabetes:Non Diabetic:  <6%Goal of Therapy: <7%Additional Action Suggested:  >8%   . Direct LDL 10/25/2018 103.0  mg/dL Final   Optimal:  <100 mg/dLNear or Above Optimal:  100-129 mg/dLBorderline High:  130-159 mg/dLHigh:  160-189 mg/dLVery High:  >190 mg/dL

## 2019-06-26 ENCOUNTER — Other Ambulatory Visit: Payer: Self-pay | Admitting: Endocrinology

## 2019-07-19 DIAGNOSIS — Z0000 Encounter for general adult medical examination without abnormal findings: Secondary | ICD-10-CM | POA: Diagnosis not present

## 2019-07-19 DIAGNOSIS — E1140 Type 2 diabetes mellitus with diabetic neuropathy, unspecified: Secondary | ICD-10-CM | POA: Diagnosis not present

## 2019-07-19 DIAGNOSIS — E559 Vitamin D deficiency, unspecified: Secondary | ICD-10-CM | POA: Diagnosis not present

## 2019-07-19 DIAGNOSIS — E7800 Pure hypercholesterolemia, unspecified: Secondary | ICD-10-CM | POA: Diagnosis not present

## 2019-08-09 DIAGNOSIS — R740 Nonspecific elevation of levels of transaminase and lactic acid dehydrogenase [LDH]: Secondary | ICD-10-CM | POA: Diagnosis not present

## 2019-09-06 ENCOUNTER — Other Ambulatory Visit: Payer: Self-pay | Admitting: Family Medicine

## 2019-09-06 DIAGNOSIS — R7401 Elevation of levels of liver transaminase levels: Secondary | ICD-10-CM

## 2019-09-07 ENCOUNTER — Ambulatory Visit
Admission: RE | Admit: 2019-09-07 | Discharge: 2019-09-07 | Disposition: A | Discharge status: Home / Self Care | Payer: BLUE CROSS/BLUE SHIELD | Source: Ambulatory Visit | Attending: Family Medicine | Admitting: Family Medicine

## 2019-09-07 DIAGNOSIS — R7401 Elevation of levels of liver transaminase levels: Secondary | ICD-10-CM

## 2019-09-07 DIAGNOSIS — K760 Fatty (change of) liver, not elsewhere classified: Secondary | ICD-10-CM | POA: Diagnosis not present

## 2019-09-25 ENCOUNTER — Other Ambulatory Visit (INDEPENDENT_AMBULATORY_CARE_PROVIDER_SITE_OTHER): Payer: BC Managed Care – PPO

## 2019-09-25 ENCOUNTER — Other Ambulatory Visit: Payer: Self-pay

## 2019-09-25 DIAGNOSIS — E1165 Type 2 diabetes mellitus with hyperglycemia: Secondary | ICD-10-CM

## 2019-09-25 DIAGNOSIS — E782 Mixed hyperlipidemia: Secondary | ICD-10-CM | POA: Diagnosis not present

## 2019-09-25 DIAGNOSIS — E1140 Type 2 diabetes mellitus with diabetic neuropathy, unspecified: Secondary | ICD-10-CM

## 2019-09-25 LAB — LIPID PANEL
Cholesterol: 153 mg/dL (ref 0–200)
HDL: 51.5 mg/dL (ref >39.00)
LDL Cholesterol: 66 mg/dL (ref 0–99)
NonHDL: 101.45
Total CHOL/HDL Ratio: 3
Triglycerides: 179 mg/dL — ABNORMAL HIGH (ref 0.0–149.0)
VLDL: 35.8 mg/dL (ref 0.0–40.0)

## 2019-09-25 LAB — CBC
HCT: 39 % (ref 39.0–52.0)
Hemoglobin: 12.7 g/dL — ABNORMAL LOW (ref 13.0–17.0)
MCHC: 32.7 g/dL (ref 30.0–36.0)
MCV: 94.8 fl (ref 78.0–100.0)
Platelets: 354 10*3/uL (ref 150.0–400.0)
RBC: 4.11 Mil/uL — ABNORMAL LOW (ref 4.22–5.81)
RDW: 11.9 % (ref 11.5–15.5)
WBC: 6.9 10*3/uL (ref 4.0–10.5)

## 2019-09-25 LAB — COMPREHENSIVE METABOLIC PANEL
ALT: 101 U/L — ABNORMAL HIGH (ref 0–53)
AST: 89 U/L — ABNORMAL HIGH (ref 0–37)
Albumin: 4.7 g/dL (ref 3.5–5.2)
Alkaline Phosphatase: 118 U/L — ABNORMAL HIGH (ref 39–117)
BUN: 11 mg/dL (ref 6–23)
CO2: 29 mEq/L (ref 19–32)
Calcium: 9.9 mg/dL (ref 8.4–10.5)
Chloride: 100 mEq/L (ref 96–112)
Creatinine, Ser: 0.79 mg/dL (ref 0.40–1.50)
GFR: 104.6 mL/min (ref >60.00)
Glucose, Bld: 133 mg/dL — ABNORMAL HIGH (ref 70–99)
Potassium: 4.4 mEq/L (ref 3.5–5.1)
Sodium: 137 mEq/L (ref 135–145)
Total Bilirubin: 0.8 mg/dL (ref 0.2–1.2)
Total Protein: 7.7 g/dL (ref 6.0–8.3)

## 2019-09-25 LAB — HEMOGLOBIN A1C: Hgb A1c MFr Bld: 6.8 % — ABNORMAL HIGH (ref 4.6–6.5)

## 2019-09-25 LAB — MICROALBUMIN / CREATININE URINE RATIO
Creatinine,U: 118.5 mg/dL
Microalb Creat Ratio: 2 mg/g (ref 0.0–30.0)
Microalb, Ur: 2.4 mg/dL — ABNORMAL HIGH (ref 0.0–1.9)

## 2019-09-28 ENCOUNTER — Other Ambulatory Visit: Payer: Self-pay

## 2019-09-28 ENCOUNTER — Ambulatory Visit (INDEPENDENT_AMBULATORY_CARE_PROVIDER_SITE_OTHER): Payer: BC Managed Care – PPO | Admitting: Endocrinology

## 2019-09-28 ENCOUNTER — Encounter: Payer: Self-pay | Admitting: Endocrinology

## 2019-09-28 VITALS — BP 142/92 | HR 92 | Temp 98.4°F | Wt 141.8 lb

## 2019-09-28 DIAGNOSIS — E782 Mixed hyperlipidemia: Secondary | ICD-10-CM

## 2019-09-28 DIAGNOSIS — Z23 Encounter for immunization: Secondary | ICD-10-CM | POA: Diagnosis not present

## 2019-09-28 DIAGNOSIS — E1140 Type 2 diabetes mellitus with diabetic neuropathy, unspecified: Secondary | ICD-10-CM

## 2019-09-28 DIAGNOSIS — R945 Abnormal results of liver function studies: Secondary | ICD-10-CM | POA: Diagnosis not present

## 2019-09-28 MED ORDER — PIOGLITAZONE HCL 30 MG PO TABS: 30.0000 mg | ORAL_TABLET | Freq: Every day | ORAL | 2 refills | Status: AC

## 2019-09-28 NOTE — Progress Notes (Signed)
Patient ID: Cole Craig, male   DOB: 04-18-1971, 48 y.o.   MRN: [id number]     Reason for Appointment: Diabetes follow-up   History of Present Illness   Diagnosis: Type 2 DIABETES MELITUS, date of diagnosis: 2012       He has had relatively mild diabetes which has been generally well controlled with low-dose metformin and subsequently Actos added for optimal control He generally has mostly fasting hyperglycemia but also has not been checking his blood sugars after meals on most of his visits as directed  Recent history:  Oral hypoglycemic drugs: Metformin ER 1500 mg, Amaryl 1 mg at 9 pm and Actos 30 mg          Has has had a relatively low A1c of under 6% previously consistently, usual range 5-5.9  However A1c was 6.7 with his PC P in July and now 6.8  Current management, problems identified and blood sugar patterns:    He had relatively higher fasting readings on his last visit but this was because of running out of Amaryl  Now he has been out of his Actos for 2 to 3 weeks  He thinks he has had a lot of stress and not clear why he has lost weight since his appetite is normal  He has checked blood sugars only in the mornings as before and forgets to do readings after meals as directed  Does not do any formal exercise although he thinks he walks up to 3 miles at work per day  Lab glucose 133 fasting  No hypoglycemic symptoms during the day  He takes he is usually watching his diet and avoiding high-fat meals   Side effects from medications: None  Monitors blood glucose:  Once a day.    Glucometer: One Touch.           Blood Glucose readings from download  Fasting range: 132-189, AVERAGE 166  Previous FASTING range 956-213    Complications: are: Neuropathy       Wt Readings from Last 3 Encounters:  09/28/19 141 lb 12.8 oz (64.3 kg)  11/03/18 152 lb (68.9 kg)  06/23/18 147 lb 9.6 oz (67 kg)   Lab Results  Component Value Date   HGBA1C 6.8 (H)  09/25/2019   HGBA1C 5.3 10/25/2018   HGBA1C 5.5 06/14/2018   Lab Results  Component Value Date   MICROALBUR 2.4 (H) 09/25/2019   LDLCALC 66 09/25/2019   CREATININE 0.79 09/25/2019    Lab on 09/25/2019  Component Date Value Ref Range Status   WBC 09/25/2019 6.9  4.0 - 10.5 K/uL Final   RBC 09/25/2019 4.11* 4.22 - 5.81 Mil/uL Final   Platelets 09/25/2019 354.0  150.0 - 400.0 K/uL Final   Hemoglobin 09/25/2019 12.7* 13.0 - 17.0 g/dL Final   HCT 09/25/2019 39.0  39.0 - 52.0 % Final   MCV 09/25/2019 94.8  78.0 - 100.0 fl Final   MCHC 09/25/2019 32.7  30.0 - 36.0 g/dL Final   RDW 09/25/2019 11.9  11.5 - 15.5 % Final   Microalb, Ur 09/25/2019 2.4* 0.0 - 1.9 mg/dL Final   Creatinine,U 09/25/2019 118.5  mg/dL Final   Microalb Creat Ratio 09/25/2019 2.0  0.0 - 30.0 mg/g Final   Cholesterol 09/25/2019 153  0 - 200 mg/dL Final   ATP III Classification       Desirable:  < 200 mg/dL               Borderline High:  200 - 239 mg/dL          High:  > = 240 mg/dL   Triglycerides 09/25/2019 179.0* 0.0 - 149.0 mg/dL Final   Normal:  <150 mg/dLBorderline High:  150 - 199 mg/dL   HDL 09/25/2019 51.50  >39.00 mg/dL Final   VLDL 09/25/2019 35.8  0.0 - 40.0 mg/dL Final   LDL Cholesterol 09/25/2019 66  0 - 99 mg/dL Final   Total CHOL/HDL Ratio 09/25/2019 3   Final                  Men          Women1/2 Average Risk     3.4          3.3Average Risk          5.0          4.42X Average Risk          9.6          7.13X Average Risk          15.0          11.0                       NonHDL 09/25/2019 101.45   Final   NOTE:  Non-HDL goal should be 30 mg/dL higher than patient's LDL goal (i.e. LDL goal of < 70 mg/dL, would have non-HDL goal of < 100 mg/dL)   Sodium 09/25/2019 137  135 - 145 mEq/L Final   Potassium 09/25/2019 4.4  3.5 - 5.1 mEq/L Final   Chloride 09/25/2019 100  96 - 112 mEq/L Final   CO2 09/25/2019 29  19 - 32 mEq/L Final   Glucose, Bld 09/25/2019 133* 70 - 99 mg/dL Final    BUN 09/25/2019 11  6 - 23 mg/dL Final   Creatinine, Ser 09/25/2019 0.79  0.40 - 1.50 mg/dL Final   Total Bilirubin 09/25/2019 0.8  0.2 - 1.2 mg/dL Final   Alkaline Phosphatase 09/25/2019 118* 39 - 117 U/L Final   AST 09/25/2019 89* 0 - 37 U/L Final   ALT 09/25/2019 101* 0 - 53 U/L Final   Total Protein 09/25/2019 7.7  6.0 - 8.3 g/dL Final   Albumin 09/25/2019 4.7  3.5 - 5.2 g/dL Final   Calcium 09/25/2019 9.9  8.4 - 10.5 mg/dL Final   GFR 09/25/2019 104.60  >60.00 mL/min Final   Hgb A1c MFr Bld 09/25/2019 6.8* 4.6 - 6.5 % Final   Glycemic Control Guidelines for People with Diabetes:Non Diabetic:  <6%Goal of Therapy: <7%Additional Action Suggested:  >8%     Allergies as of 09/28/2019      Reactions   Iohexol     Code: RASH, Desc: RASH AND TONGUE SWELLING   Ivp Dye [iodinated Diagnostic Agents]       Medication List       Accurate as of September 28, 2019 10:45 AM. If you have any questions, ask your nurse or doctor.        DULoxetine 30 MG capsule Commonly known as: CYMBALTA Take 3 capsules (90 mg total) by mouth at bedtime.   glimepiride 1 MG tablet Commonly known as: Amaryl 1 tab at dinner daily   glucose blood test strip Commonly known as: ONE TOUCH ULTRA TEST Use as instructed to check blood sugars 3 times per day Dx code E11.9   Melatonin 10 MG Caps Take by mouth.   metFORMIN 750 MG 24 hr tablet Commonly  known as: GLUCOPHAGE-XR TAKE 2 TABLETS (1,500MG    TOTAL) DAILY WITH BREAKFAST   ONE TOUCH ULTRA 2 w/Device Kit Use to check blood sugar 3 times per day dx code 250.00   pantoprazole 40 MG tablet Commonly known as: PROTONIX TAKE 1 TABLET DAILY   pioglitazone 30 MG tablet Commonly known as: ACTOS Take 1 tablet (30 mg total) by mouth daily.   rosuvastatin 20 MG tablet Commonly known as: Crestor Take 1 tablet (20 mg total) by mouth daily.   Vitamin D3 50 MCG (2000 UT) Tabs Take 2,000 Units by mouth 2 (two) times daily.   Zenpep 20000-68000  units Cpep Generic drug: Pancrelipase (Lip-Prot-Amyl) Take 25,000 Units by mouth.       Allergies:  Allergies  Allergen Reactions   Iohexol      Code: RASH, Desc: RASH AND TONGUE SWELLING    Ivp Dye [Iodinated Diagnostic Agents]     Past Medical History:  Diagnosis Date   Diabetes mellitus    GERD (gastroesophageal reflux disease)    Hyperlipidemia    Pancreatic cystadenoma    Pancreatitis     Past Surgical History:  Procedure Laterality Date   CHOLECYSTECTOMY     circa 2003 in [place], [place]     pancreatitis surgery      circa 2003 in [place], [place]    Family History  Problem Relation Age of Onset   Arthritis Sister    Diabetes Mother    Diabetes Brother     Social History:  reports that he quit smoking about 7 years ago. His smoking use included cigars. He has never used smokeless tobacco. He reports current alcohol use of about 1.0 standard drinks of alcohol per week. He reports that he does not use drugs.  Review of Systems:   Has previous history of fatty liver with mildly abnormal liver function tests and abnormal radiological studies also as far back as 2009 However since this summer his liver function appeared to be unusually high He has fatty liver on his repeat ultrasound done by PCP recently but liver functions continue to be high He has lost weight although he thinks this is from stress  Lab Results  Component Value Date   ALT 101 (H) 09/25/2019     HYPERLIPIDEMIA: The lipid abnormality consists of elevated LDL treated with Lipitor Because of his triglycerides being slightly high and LDL being higher he was switched from Lipitor to Crestor 20 mg daily in 11/19  Although he thinks he was taking atorvastatin on his last visit this had not been renewed LDL excellent   Lab Results  Component Value Date   CHOL 153 09/25/2019   HDL 51.50 09/25/2019   LDLCALC 66 09/25/2019   LDLDIRECT 103.0 10/25/2018   TRIG  179.0 (H) 09/25/2019   CHOLHDL 3 09/25/2019    NEUROPATHY:  He  has had symptoms of burning and sharp pains in his legs since about 2012, probably about the time of his diabetes diagnosis No symptoms of numbness in his hands but has some burning He was seen by the neurologist previously and no specific diagnosis made, reportedly had a normal nerve conduction study  His symptoms are usually mostly present at night Since he had not been getting relief with Neurontin and later Lyrica, he has been on Cymbalta 90 mg Usually has relief of symptoms with duloxetine Was also asked to try additional Lyrica at night  No eye exam report available from 2019  Diabetic foot exam in 7/19 shows normal monofilament sensation in the toes and plantar surfaces, no skin lesions or ulcers on the feet and normal pedal pulses   His PCP asked him to start checking his blood pressure at home but he has not not instrument as yet Blood pressure is again mildly increased  BP Readings from Last 3 Encounters:  09/28/19 (!) 142/92  11/03/18 132/88  06/23/18 108/68       Examination:   BP (!) 142/92    Pulse 92    Temp 98.4 F (36.9 C)    Wt 141 lb 12.8 oz (64.3 kg)    SpO2 97%    BMI 21.56 kg/m   Body mass index is 21.56 kg/m.   ASSESSMENT/ PLAN:   Diabetes type 2 with normal BMI  See history of present illness for detailed discussion of current diabetes management, blood sugar patterns and problems identified  His A1c is 6.8 which is slightly higher than expected  Although previously A1c had been below 6 this may have been a falsely low However he may have postprandial readings which he does not monitor As before fasting readings are fairly good with mild increase overall, no change from before  Not clear if he has been out of his Actos recently also Although he is very active at work and he thinks his diet is fairly good he has lost weight for other reasons Discussed importance of checking  readings after meals to help identify postprandial hyperglycemia In the meantime he will start taking his Amaryl before dinner instead of after We will also check fructosamine on next visit  NEUROPATHY: Chronic symptoms with recent relief of pain, continue same regimen  Abnormal LIVER functions: This may be related to rosuvastatin since this was started late last year and his liver functions this year have been higher persistently Although he does have fatty liver his liver functions in June 2019 were normal  He will stop rosuvastatin for now and follow-up with his gastroenterologist for review May consider going back to atorvastatin once liver functions are back to normal  LIPIDS: His levels are well controlled, apparently is taking Crestor  Influenza vaccine given  Follow-up in 3 months  Total visit time for evaluation and management of multiple problems and counseling =25 minutes  Patient Instructions  Glimeperide before dinner  Check blood sugars on waking up 3 days a week  Also check blood sugars about 2 hours after meals and do this after different meals by rotation  Recommended blood sugar levels on waking up are 90-130 and about 2 hours after meal is 130-160  Please bring your blood sugar monitor to each visit, thank you  Stop rosuvastatin         Elayne Snare 09/28/2019, 10:45 AM    No visits with results within 1 Day(s) from this visit.  Latest known visit with results is:  Lab on 09/25/2019  Component Date Value Ref Range Status   WBC 09/25/2019 6.9  4.0 - 10.5 K/uL Final   RBC 09/25/2019 4.11* 4.22 - 5.81 Mil/uL Final   Platelets 09/25/2019 354.0  150.0 - 400.0 K/uL Final   Hemoglobin 09/25/2019 12.7* 13.0 - 17.0 g/dL Final   HCT 09/25/2019 39.0  39.0 - 52.0 % Final   MCV 09/25/2019 94.8  78.0 - 100.0 fl Final   MCHC 09/25/2019 32.7  30.0 - 36.0 g/dL Final   RDW 09/25/2019 11.9  11.5 - 15.5 % Final   Microalb, Ur 09/25/2019 2.4* 0.0 -  1.9 mg/dL  Final   Creatinine,U 09/25/2019 118.5  mg/dL Final   Microalb Creat Ratio 09/25/2019 2.0  0.0 - 30.0 mg/g Final   Cholesterol 09/25/2019 153  0 - 200 mg/dL Final   ATP III Classification       Desirable:  < 200 mg/dL               Borderline High:  200 - 239 mg/dL          High:  > = 240 mg/dL   Triglycerides 09/25/2019 179.0* 0.0 - 149.0 mg/dL Final   Normal:  <150 mg/dLBorderline High:  150 - 199 mg/dL   HDL 09/25/2019 51.50  >39.00 mg/dL Final   VLDL 09/25/2019 35.8  0.0 - 40.0 mg/dL Final   LDL Cholesterol 09/25/2019 66  0 - 99 mg/dL Final   Total CHOL/HDL Ratio 09/25/2019 3   Final                  Men          Women1/2 Average Risk     3.4          3.3Average Risk          5.0          4.42X Average Risk          9.6          7.13X Average Risk          15.0          11.0                       NonHDL 09/25/2019 101.45   Final   NOTE:  Non-HDL goal should be 30 mg/dL higher than patient's LDL goal (i.e. LDL goal of < 70 mg/dL, would have non-HDL goal of < 100 mg/dL)   Sodium 09/25/2019 137  135 - 145 mEq/L Final   Potassium 09/25/2019 4.4  3.5 - 5.1 mEq/L Final   Chloride 09/25/2019 100  96 - 112 mEq/L Final   CO2 09/25/2019 29  19 - 32 mEq/L Final   Glucose, Bld 09/25/2019 133* 70 - 99 mg/dL Final   BUN 09/25/2019 11  6 - 23 mg/dL Final   Creatinine, Ser 09/25/2019 0.79  0.40 - 1.50 mg/dL Final   Total Bilirubin 09/25/2019 0.8  0.2 - 1.2 mg/dL Final   Alkaline Phosphatase 09/25/2019 118* 39 - 117 U/L Final   AST 09/25/2019 89* 0 - 37 U/L Final   ALT 09/25/2019 101* 0 - 53 U/L Final   Total Protein 09/25/2019 7.7  6.0 - 8.3 g/dL Final   Albumin 09/25/2019 4.7  3.5 - 5.2 g/dL Final   Calcium 09/25/2019 9.9  8.4 - 10.5 mg/dL Final   GFR 09/25/2019 104.60  >60.00 mL/min Final   Hgb A1c MFr Bld 09/25/2019 6.8* 4.6 - 6.5 % Final   Glycemic Control Guidelines for People with Diabetes:Non Diabetic:  <6%Goal of Therapy: <7%Additional Action Suggested:  >8%

## 2019-09-28 NOTE — Patient Instructions (Addendum)
Glimeperide before dinner  Check blood sugars on waking up 3 days a week  Also check blood sugars about 2 hours after meals and do this after different meals by rotation  Recommended blood sugar levels on waking up are 90-130 and about 2 hours after meal is 130-160  Please bring your blood sugar monitor to each visit, thank you  Stop rosuvastatin

## 2019-10-17 ENCOUNTER — Telehealth: Payer: Self-pay | Admitting: Endocrinology

## 2019-10-17 NOTE — Telephone Encounter (Signed)
[employer] called to clarify patients RX for [place] - ref [id number] and the phone number is [phone]

## 2019-10-17 NOTE — Telephone Encounter (Signed)
This request was sent to [place] via fax, and will be returned via fax.

## 2019-11-22 ENCOUNTER — Other Ambulatory Visit: Payer: BC Managed Care – PPO

## 2019-11-27 ENCOUNTER — Other Ambulatory Visit (INDEPENDENT_AMBULATORY_CARE_PROVIDER_SITE_OTHER): Payer: BC Managed Care – PPO

## 2019-11-27 ENCOUNTER — Other Ambulatory Visit: Payer: Self-pay

## 2019-11-27 DIAGNOSIS — R945 Abnormal results of liver function studies: Secondary | ICD-10-CM

## 2019-11-27 DIAGNOSIS — E1140 Type 2 diabetes mellitus with diabetic neuropathy, unspecified: Secondary | ICD-10-CM | POA: Diagnosis not present

## 2019-11-27 LAB — LIPID PANEL
Cholesterol: 160 mg/dL (ref 0–200)
HDL: 55.9 mg/dL (ref >39.00)
LDL Cholesterol: 81 mg/dL (ref 0–99)
NonHDL: 104.54
Total CHOL/HDL Ratio: 3
Triglycerides: 118 mg/dL (ref 0.0–149.0)
VLDL: 23.6 mg/dL (ref 0.0–40.0)

## 2019-11-27 LAB — COMPREHENSIVE METABOLIC PANEL
ALT: 47 U/L (ref 0–53)
AST: 43 U/L — ABNORMAL HIGH (ref 0–37)
Albumin: 4.3 g/dL (ref 3.5–5.2)
Alkaline Phosphatase: 87 U/L (ref 39–117)
BUN: 17 mg/dL (ref 6–23)
CO2: 29 mEq/L (ref 19–32)
Calcium: 9.6 mg/dL (ref 8.4–10.5)
Chloride: 98 mEq/L (ref 96–112)
Creatinine, Ser: 0.82 mg/dL (ref 0.40–1.50)
GFR: 100.12 mL/min (ref >60.00)
Glucose, Bld: 178 mg/dL — ABNORMAL HIGH (ref 70–99)
Potassium: 4.6 mEq/L (ref 3.5–5.1)
Sodium: 136 mEq/L (ref 135–145)
Total Bilirubin: 0.8 mg/dL (ref 0.2–1.2)
Total Protein: 7.3 g/dL (ref 6.0–8.3)

## 2019-11-27 LAB — HEMOGLOBIN A1C: Hgb A1c MFr Bld: 6.3 % (ref 4.6–6.5)

## 2019-11-27 NOTE — Progress Notes (Signed)
Patient ID: Cole Craig, male   DOB: 05-Jun-1971, 48 y.o.   MRN: [id number]     Reason for Appointment: Diabetes follow-up   History of Present Illness   Diagnosis: Type 2 DIABETES MELITUS, date of diagnosis: 2012       He has had relatively mild diabetes which has been generally well controlled with low-dose metformin and subsequently Actos added for optimal control He generally has mostly fasting hyperglycemia but also has not been checking his blood sugars after meals on most of his visits as directed  Recent history:  Oral hypoglycemic drugs: Metformin ER 1500 mg, Amaryl 1 mg at 9 pm and Actos 30 mg          Current management, problems identified and blood sugar patterns:  His A1c is back to 6.3, previously 6.8   He had probably been out of his Actos for some time prior to his last visit  Also he thinks his sugar may have been higher because of stress with fasting readings averaging about 166  Unable to download his meter today because of an error  However he thinks his blood sugars are mostly in the 120-130 range fasting and no higher than 170 after meals  Blood sugar after lunch in the lab was 178  He is trying to do some walking but not regular  Previously had lost weight from stress and now this is improved  No hypoglycemic symptoms except when he is late for lunch and he may feel a little hungry and shaky  He takes he is usually watching his diet and making good choices at fast food restaurants and avoiding high-fat meals   Side effects from medications: None  Monitors blood glucose:  Once a day.    Glucometer: One Touch.           Blood Glucose readings from patient recall  Am 120-130 pc 170  Previously: Fasting range: 132-189, AVERAGE 093   Complications: are: Neuropathy       Wt Readings from Last 3 Encounters:  11/28/19 146 lb (66.2 kg)  09/28/19 141 lb 12.8 oz (64.3 kg)  11/03/18 152 lb (68.9 kg)   Lab Results  Component Value  Date   HGBA1C 6.3 11/27/2019   HGBA1C 6.8 (H) 09/25/2019   HGBA1C 5.3 10/25/2018   Lab Results  Component Value Date   MICROALBUR 2.4 (H) 09/25/2019   LDLCALC 81 11/27/2019   CREATININE 0.82 11/27/2019    Lab on 11/27/2019  Component Date Value Ref Range Status  . Fructosamine 11/27/2019 303* 0 - 285 umol/L Final   Comment: Published reference interval for apparently healthy subjects between age 26 and 82 is 29 - 285 umol/L and in a poorly controlled diabetic population is 228 - 563 umol/L with a mean of 396 umol/L.   Marland Kitchen Cholesterol 11/27/2019 160  0 - 200 mg/dL Final   ATP III Classification       Desirable:  < 200 mg/dL               Borderline High:  200 - 239 mg/dL          High:  > = 240 mg/dL  . Triglycerides 11/27/2019 118.0  0.0 - 149.0 mg/dL Final   Normal:  <150 mg/dLBorderline High:  150 - 199 mg/dL  . HDL 11/27/2019 55.90  >39.00 mg/dL Final  . VLDL 11/27/2019 23.6  0.0 - 40.0 mg/dL Final  . LDL Cholesterol 11/27/2019 81  0 - 99 mg/dL  Final  . Total CHOL/HDL Ratio 11/27/2019 3   Final                  Men          Women1/2 Average Risk     3.4          3.3Average Risk          5.0          4.42X Average Risk          9.6          7.13X Average Risk          15.0          11.0                      . NonHDL 11/27/2019 104.54   Final   NOTE:  Non-HDL goal should be 30 mg/dL higher than patient's LDL goal (i.e. LDL goal of < 70 mg/dL, would have non-HDL goal of < 100 mg/dL)  . Sodium 11/27/2019 136  135 - 145 mEq/L Final  . Potassium 11/27/2019 4.6  3.5 - 5.1 mEq/L Final  . Chloride 11/27/2019 98  96 - 112 mEq/L Final  . CO2 11/27/2019 29  19 - 32 mEq/L Final  . Glucose, Bld 11/27/2019 178* 70 - 99 mg/dL Final  . BUN 11/27/2019 17  6 - 23 mg/dL Final  . Creatinine, Ser 11/27/2019 0.82  0.40 - 1.50 mg/dL Final  . Total Bilirubin 11/27/2019 0.8  0.2 - 1.2 mg/dL Final  . Alkaline Phosphatase 11/27/2019 87  39 - 117 U/L Final  . AST 11/27/2019 43* 0 - 37 U/L Final  . ALT  11/27/2019 47  0 - 53 U/L Final  . Total Protein 11/27/2019 7.3  6.0 - 8.3 g/dL Final  . Albumin 11/27/2019 4.3  3.5 - 5.2 g/dL Final  . GFR 11/27/2019 100.12  >60.00 mL/min Final  . Calcium 11/27/2019 9.6  8.4 - 10.5 mg/dL Final  . Hgb A1c MFr Bld 11/27/2019 6.3  4.6 - 6.5 % Final   Glycemic Control Guidelines for People with Diabetes:Non Diabetic:  <6%Goal of Therapy: <7%Additional Action Suggested:  >8%     Allergies as of 11/28/2019      Reactions   Iohexol     Code: RASH, Desc: RASH AND TONGUE SWELLING   Ivp Dye [iodinated Diagnostic Agents]       Medication List       Accurate as of November 28, 2019  8:33 AM. If you have any questions, ask your nurse or doctor.        DULoxetine 30 MG capsule Commonly known as: CYMBALTA Take 3 capsules (90 mg total) by mouth at bedtime.   glimepiride 1 MG tablet Commonly known as: Amaryl 1 tab at dinner daily   glucose blood test strip Commonly known as: ONE TOUCH ULTRA TEST Use as instructed to check blood sugars 3 times per day Dx code E11.9   Melatonin 10 MG Caps Take by mouth.   metFORMIN 750 MG 24 hr tablet Commonly known as: GLUCOPHAGE-XR TAKE 2 TABLETS (1,500MG    TOTAL) DAILY WITH BREAKFAST   ONE TOUCH ULTRA 2 w/Device Kit Use to check blood sugar 3 times per day dx code 250.00   pantoprazole 40 MG tablet Commonly known as: PROTONIX TAKE 1 TABLET DAILY   pioglitazone 30 MG tablet Commonly known as: ACTOS Take 1 tablet (30 mg total) by mouth daily.  rosuvastatin 20 MG tablet Commonly known as: Crestor Take 1 tablet (20 mg total) by mouth daily.   Vitamin D3 50 MCG (2000 UT) Tabs Take 2,000 Units by mouth 2 (two) times daily.   Zenpep 20000-68000 units Cpep Generic drug: Pancrelipase (Lip-Prot-Amyl) Take 25,000 Units by mouth.       Allergies:  Allergies  Allergen Reactions  . Iohexol      Code: RASH, Desc: RASH AND TONGUE SWELLING   . Ivp Dye [Iodinated Diagnostic Agents]     Past Medical  History:  Diagnosis Date  . Diabetes mellitus   . GERD (gastroesophageal reflux disease)   . Hyperlipidemia   . Pancreatic cystadenoma   . Pancreatitis     Past Surgical History:  Procedure Laterality Date  . CHOLECYSTECTOMY     circa 2003 in [place], [place]  . HERNIA REPAIR    . pancreatitis surgery      circa 2003 in [place], [place]    Family History  Problem Relation Age of Onset  . Arthritis Sister   . Diabetes Mother   . Diabetes Brother     Social History:  reports that he quit smoking about 7 years ago. His smoking use included cigars. He has never used smokeless tobacco. He reports current alcohol use of about 1.0 standard drinks of alcohol per week. He reports that he does not use drugs.  Review of Systems:   Has previous history of fatty liver with mildly abnormal liver function tests and abnormal radiological studies also as far back as 2009 However since this summer his liver function appeared to be unusually high He has fatty liver on his repeat ultrasound done by PCP but liver functions continue to be high    Lab Results  Component Value Date   ALT 47 11/27/2019   ALT 101 (H) 09/25/2019   ALT 29 06/14/2018      HYPERLIPIDEMIA: The lipid abnormality consists of elevated LDL treated with Lipitor Because of his triglycerides being slightly high and LDL being higher he was switched from Lipitor to Crestor 20 mg daily in 11/19  For some reason he had continued to get refills for both rosuvastatin and atorvastatin on the last visit and was told to discontinue rosuvastatin However on his own he has discontinued only the atorvastatin  LDL controlled   Lab Results  Component Value Date   CHOL 160 11/27/2019   HDL 55.90 11/27/2019   LDLCALC 81 11/27/2019   LDLDIRECT 103.0 10/25/2018   TRIG 118.0 11/27/2019   CHOLHDL 3 11/27/2019    NEUROPATHY:  He  has had symptoms of burning and sharp pains in his legs since about 2012, probably about the time of  his diabetes diagnosis No symptoms of numbness in his hands but has some burning He was seen by the neurologist previously and no specific diagnosis made, reportedly had a normal nerve conduction study  His symptoms are mostly present at night Since he had not been getting relief with Neurontin and later Lyrica, he has been on Cymbalta 90 mg Has some relief of symptoms with duloxetine Was also asked to try additional Lyrica at night but not taking this  No eye exam report available from 2019 He goes to [facility]   Diabetic foot exam in 12/20  Blood pressure is better than before  BP Readings from Last 3 Encounters:  11/28/19 138/82  09/28/19 (!) 142/92  11/03/18 132/88       Examination:   BP 138/82 (BP  Location: Left Arm, Patient Position: Sitting, Cuff Size: Normal)   Pulse 72   Temp 98.6 F (37 C)   Ht _0  (1.727 m)   Wt 146 lb (66.2 kg)   SpO2 98%   BMI 22.20 kg/m   Body mass index is 22.2 kg/m.   Diabetic Foot Exam - Simple   Simple Foot Form Diabetic Foot exam was performed with the following findings: Yes   Visual Inspection No deformities, no ulcerations, no other skin breakdown bilaterally: Yes Sensation Testing Intact to touch and monofilament testing bilaterally: Yes Pulse Check See comments: Yes Comments Unable to palpate left pedal pulses     ASSESSMENT/ PLAN:   Diabetes type 2 with normal BMI  See history of present illness for detailed discussion of current diabetes management, blood sugar patterns and problems identified  His A1c is 6.3 although fructosamine is 303  Unable to review his blood sugars from his meter download He reports better readings With his being consistent with his Actos and also reduce stress his blood sugars are likely better Still has mildly increased readings after meals as seen on the lab No change in medication recommended Continue to monitor blood sugars closely especially after meals  NEUROPATHY: Chronic  symptoms with fair amount of relief of pain, continue same regimen Again we will discuss adding Lyrica if he has increasing symptoms Discussed checking feet regularly on the plantar surfaces  Abnormal LIVER functions: Resolved almost completely Likely from taking both rosuvastatin and atorvastatin by mistake Since he has excellent LDL levels and currently taking rosuvastatin without side effects we will continue this  LIPIDS: His levels are well controlled, he is is taking Crestor and will continue   Follow-up in 4 months   Total visit time for evaluation and management of multiple problems and counseling =25 minutes  There are no Patient Instructions on file for this visit.  Elayne Snare 11/28/2019, 8:33 AM    No visits with results within 1 Day(s) from this visit.  Latest known visit with results is:  Lab on 11/27/2019  Component Date Value Ref Range Status  . Fructosamine 11/27/2019 303* 0 - 285 umol/L Final   Comment: Published reference interval for apparently healthy subjects between age 21 and 61 is 37 - 285 umol/L and in a poorly controlled diabetic population is 228 - 563 umol/L with a mean of 396 umol/L.   Marland Kitchen Cholesterol 11/27/2019 160  0 - 200 mg/dL Final   ATP III Classification       Desirable:  < 200 mg/dL               Borderline High:  200 - 239 mg/dL          High:  > = 240 mg/dL  . Triglycerides 11/27/2019 118.0  0.0 - 149.0 mg/dL Final   Normal:  <150 mg/dLBorderline High:  150 - 199 mg/dL  . HDL 11/27/2019 55.90  >39.00 mg/dL Final  . VLDL 11/27/2019 23.6  0.0 - 40.0 mg/dL Final  . LDL Cholesterol 11/27/2019 81  0 - 99 mg/dL Final  . Total CHOL/HDL Ratio 11/27/2019 3   Final                  Men          Women1/2 Average Risk     3.4          3.3Average Risk          5.0  4.42X Average Risk          9.6          7.13X Average Risk          15.0          11.0                      . NonHDL 11/27/2019 104.54   Final   NOTE:  Non-HDL goal should be 30  mg/dL higher than patient's LDL goal (i.e. LDL goal of < 70 mg/dL, would have non-HDL goal of < 100 mg/dL)  . Sodium 11/27/2019 136  135 - 145 mEq/L Final  . Potassium 11/27/2019 4.6  3.5 - 5.1 mEq/L Final  . Chloride 11/27/2019 98  96 - 112 mEq/L Final  . CO2 11/27/2019 29  19 - 32 mEq/L Final  . Glucose, Bld 11/27/2019 178* 70 - 99 mg/dL Final  . BUN 11/27/2019 17  6 - 23 mg/dL Final  . Creatinine, Ser 11/27/2019 0.82  0.40 - 1.50 mg/dL Final  . Total Bilirubin 11/27/2019 0.8  0.2 - 1.2 mg/dL Final  . Alkaline Phosphatase 11/27/2019 87  39 - 117 U/L Final  . AST 11/27/2019 43* 0 - 37 U/L Final  . ALT 11/27/2019 47  0 - 53 U/L Final  . Total Protein 11/27/2019 7.3  6.0 - 8.3 g/dL Final  . Albumin 11/27/2019 4.3  3.5 - 5.2 g/dL Final  . GFR 11/27/2019 100.12  >60.00 mL/min Final  . Calcium 11/27/2019 9.6  8.4 - 10.5 mg/dL Final  . Hgb A1c MFr Bld 11/27/2019 6.3  4.6 - 6.5 % Final   Glycemic Control Guidelines for People with Diabetes:Non Diabetic:  <6%Goal of Therapy: <7%Additional Action Suggested:  >8%

## 2019-11-28 ENCOUNTER — Other Ambulatory Visit: Payer: Self-pay

## 2019-11-28 ENCOUNTER — Encounter: Payer: Self-pay | Admitting: Endocrinology

## 2019-11-28 ENCOUNTER — Ambulatory Visit (INDEPENDENT_AMBULATORY_CARE_PROVIDER_SITE_OTHER): Payer: BC Managed Care – PPO | Admitting: Endocrinology

## 2019-11-28 VITALS — BP 138/82 | HR 72 | Temp 98.6°F | Ht 68.0 in | Wt 146.0 lb

## 2019-11-28 DIAGNOSIS — R945 Abnormal results of liver function studies: Secondary | ICD-10-CM | POA: Diagnosis not present

## 2019-11-28 DIAGNOSIS — E782 Mixed hyperlipidemia: Secondary | ICD-10-CM | POA: Diagnosis not present

## 2019-11-28 DIAGNOSIS — E1140 Type 2 diabetes mellitus with diabetic neuropathy, unspecified: Secondary | ICD-10-CM

## 2019-11-28 LAB — FRUCTOSAMINE: Fructosamine: 303 umol/L — ABNORMAL HIGH (ref 0–285)

## 2019-11-28 MED ORDER — BLOOD GLUCOSE METER KIT: PACK | 0 refills | Status: DC

## 2019-12-27 ENCOUNTER — Telehealth: Payer: Self-pay | Admitting: Endocrinology

## 2019-12-27 NOTE — Telephone Encounter (Signed)
na

## 2020-01-09 ENCOUNTER — Encounter: Payer: Self-pay | Admitting: Gastroenterology

## 2020-01-11 ENCOUNTER — Ambulatory Visit (INDEPENDENT_AMBULATORY_CARE_PROVIDER_SITE_OTHER): Payer: BC Managed Care – PPO | Admitting: Gastroenterology

## 2020-01-11 ENCOUNTER — Encounter: Payer: Self-pay | Admitting: Gastroenterology

## 2020-01-11 ENCOUNTER — Other Ambulatory Visit: Payer: Self-pay

## 2020-01-11 VITALS — BP 130/92 | HR 100 | Temp 98.2°F | Ht 68.0 in | Wt 151.0 lb

## 2020-01-11 DIAGNOSIS — K760 Fatty (change of) liver, not elsewhere classified: Secondary | ICD-10-CM | POA: Diagnosis not present

## 2020-01-11 NOTE — Progress Notes (Signed)
Chief Complaint:   Referring Provider:  Lujean Amel, MD      ASSESSMENT AND PLAN;   #1. H/O severe necrotizing pancreatitis s/p surgical necrosectomy with partial pancreatectomy, cholecystectomy 2003 at [facility]. history of acute pancreatitis 02/2012.  Now with brittle DM2.  No exocrine pancreatic insufficiency.  #2. Fatty liver with abn LFTs. Known Hep B carrier.  #3.  Note allergy to iodinated contrast for CT (rash and tongue swelling)  Plan: - CBC, CMP, lipase, GGT, HBsAg, HBeAg, Anti HBsAb, anti HAV, HBA1c - Appt with Dr Chalmers Cater (RE: DM). - USE - No ETOH - Continue with normal protein, diabetic diet - fluticasone cream 0.05% generic 30g 1 bid PR x 10 days, 2 refills - Avoid NSAIDs. - Call if still with problems in 2 weeks. - FU in 6 months.    HPI:    Cole Craig is a 49 y.o. male  For follow-up visit.  No nausea, vomiting, heartburn, regurgitation, odynophagia or dysphagia.  No significant diarrhea or constipation.  No melena or hematochezia. No unintentional weight loss. No abdominal pain.  Occ ETOH -more socially.  Has been working hard.  Quite concerned about diabetes.  Compliant with medications.  Most recent LFTs have been trending down.   Past Medical History:  Diagnosis Date  . Diabetes mellitus   . GERD (gastroesophageal reflux disease)   . Hepatitis B   . Hyperlipidemia   . Pancreatic cystadenoma   . Pancreatitis   . Vitamin D deficiency     Past Surgical History:  Procedure Laterality Date  . CHOLECYSTECTOMY     circa 2003 in [place], [place]  . HERNIA REPAIR Right    incisional and inguinal hernia  . pancreatitis surgery      circa 2003 in [place], [place]    Family History  Problem Relation Age of Onset  . Throat cancer Father   . Diabetes Mother   . Diabetes Brother   . Arthritis Sister   . Colon cancer Neg Hx     Social History   Tobacco Use  . Smoking status: Former Smoker    Types: Cigars    Quit date: 12/29/2011   Years since quitting: 8.0  . Smokeless tobacco: Never Used  Substance Use Topics  . Alcohol use: Yes    Alcohol/week: 1.0 standard drinks    Types: 1 Glasses of wine per week    Comment: occ  . Drug use: No    Current Outpatient Medications  Medication Sig Dispense Refill  . B Complex Vitamins (VITAMIN B COMPLEX PO) Take 1 tablet by mouth daily.    . blood glucose meter kit and supplies Dispense based on patient and insurance preference. Use up to four times daily as directed. (FOR ICD-10 E10.9, E11.9). 1 each 0  . Blood Glucose Monitoring Suppl (ONE TOUCH ULTRA 2) W/DEVICE KIT Use to check blood sugar 3 times per day dx code 250.00 1 each 0  . Cholecalciferol (VITAMIN D3) 2000 UNITS TABS Take 2,000 Units by mouth 2 (two) times daily.    . DULoxetine (CYMBALTA) 30 MG capsule Take 3 capsules (90 mg total) by mouth at bedtime. 270 capsule 1  . glimepiride (AMARYL) 1 MG tablet 1 tab at dinner daily 90 tablet 2  . glucose blood (ONE TOUCH ULTRA TEST) test strip Use as instructed to check blood sugars 3 times per day Dx code E11.9 300 each 1  . Melatonin 10 MG CAPS Take by mouth.    Marland Kitchen  metFORMIN (GLUCOPHAGE-XR) 750 MG 24 hr tablet TAKE 2 TABLETS (1,500MG    TOTAL) DAILY WITH BREAKFAST 180 tablet 2  . Pancrelipase, Lip-Prot-Amyl, (ZENPEP) 20000 UNITS CPEP Take 25,000 Units by mouth.    . pantoprazole (PROTONIX) 40 MG tablet TAKE 1 TABLET DAILY 90 tablet 2  . pioglitazone (ACTOS) 30 MG tablet Take 1 tablet (30 mg total) by mouth daily. 90 tablet 2   No current facility-administered medications for this visit.    Allergies  Allergen Reactions  . Iohexol      Code: RASH, Desc: RASH AND TONGUE SWELLING   . Ivp Dye [Iodinated Diagnostic Agents]     Review of Systems:  Constitutional: Denies fever, chills, diaphoresis, appetite change and fatigue.  HEENT: Denies photophobia, eye pain, redness, hearing loss, ear pain, congestion, sore throat, rhinorrhea, sneezing, mouth sores, neck pain, neck  stiffness and tinnitus.   Respiratory: Denies SOB, DOE, cough, chest tightness,  and wheezing.   Cardiovascular: Denies chest pain, palpitations and leg swelling.  Genitourinary: Denies dysuria, urgency, frequency, hematuria, flank pain and difficulty urinating.  Musculoskeletal: Denies myalgias, back pain, joint swelling, arthralgias and gait problem.  Skin: No rash.  Neurological: Denies dizziness, seizures, syncope, weakness, light-headedness, numbness and headaches.  Hematological: Denies adenopathy. Easy bruising, personal or family bleeding history  Psychiatric/Behavioral: No anxiety or depression     Physical Exam:    BP (!) 130/92   Pulse 100   Temp 98.2 F (36.8 C)   Ht '5\' 8"'  (1.727 m)   Wt 151 lb (68.5 kg)   BMI 22.96 kg/m  Wt Readings from Last 3 Encounters:  01/11/20 151 lb (68.5 kg)  11/28/19 146 lb (66.2 kg)  09/28/19 141 lb 12.8 oz (64.3 kg)   Constitutional:  Well-developed, in no acute distress. Psychiatric: Normal mood and affect. Behavior is normal. HEENT: Pupils normal.  Conjunctivae are normal. No scleral icterus. Neck supple.  Cardiovascular: Normal rate, regular rhythm. No edema Pulmonary/chest: Effort normal and breath sounds normal. No wheezing, rales or rhonchi. Abdominal: Soft, nondistended. Nontender. Bowel sounds active throughout. There are no masses palpable. No hepatomegaly. Rectal:  defered Neurological: Alert and oriented to person place and time. Skin: Skin is warm and dry. No rashes noted.  Data Reviewed: I have personally reviewed following labs and imaging studies  CBC: CBC Latest Ref Rng & Units 09/25/2019 03/17/2012 03/16/2012  WBC 4.0 - 10.5 K/uL 6.9 7.4 9.7  Hemoglobin 13.0 - 17.0 g/dL 12.7(L) 12.4(L) 11.8(L)  Hematocrit 39.0 - 52.0 % 39.0 37.2(L) 35.3(L)  Platelets 150.0 - 400.0 K/uL 354.0 329 276    CMP: CMP Latest Ref Rng & Units 11/27/2019 09/25/2019 10/25/2018  Glucose 70 - 99 mg/dL 178(H) 133(H) 114(H)  BUN 6 - 23 mg/dL '17  11 12  ' Creatinine 0.40 - 1.50 mg/dL 0.82 0.79 0.88  Sodium 135 - 145 mEq/L 136 137 137  Potassium 3.5 - 5.1 mEq/L 4.6 4.4 4.2  Chloride 96 - 112 mEq/L 98 100 100  CO2 19 - 32 mEq/L '29 29 28  ' Calcium 8.4 - 10.5 mg/dL 9.6 9.9 9.7  Total Protein 6.0 - 8.3 g/dL 7.3 7.7 -  Total Bilirubin 0.2 - 1.2 mg/dL 0.8 0.8 -  Alkaline Phos 39 - 117 U/L 87 118(H) -  AST 0 - 37 U/L 43(H) 89(H) -  ALT 0 - 53 U/L 47 101(H) -  25 minutes spent with the patient today. Greater than 50% was spent in counseling and coordination of care with the patient  Carmell Austria, MD 01/11/2020, 9:19 AM  Cc: Lujean Amel, MD

## 2020-01-12 MED ORDER — FLUTICASONE PROPIONATE 0.05 % EX CREA: TOPICAL_CREAM | Freq: Two times a day (BID) | CUTANEOUS | 2 refills | Status: DC

## 2020-01-12 NOTE — Patient Instructions (Addendum)
If you are age 49 or older, your body mass index should be between 23-30. Your Body mass index is 22.96 kg/m. If this is out of the aforementioned range listed, please consider follow up with your Primary Care Provider.  If you are age 67 or younger, your body mass index should be between 19-25. Your Body mass index is 22.96 kg/m. If this is out of the aformentioned range listed, please consider follow up with your Primary Care Provider.   Please go to the lab at [facility] ([place] You will need to go to level "B", you do not need an appointment for this. Hours available are 7:30 am - 4:30 pm.   You have been scheduled for an abdominal ultrasound with elastography at [facility] (1st floor). Your appointment is scheduled for 01/18/20 at Time. Please arrive 15 minutes prior to your scheduled appointment for registration purposes. Make certain not to have anything to eat or drink 6 hours prior to your procedure. Should you need to reschedule your appointment, you may contact radiology at [phone].  Liver Elastography Various chronic liver diseases such as hepatitis B, C, and fatty liver disease can lead to tissue damage and subsequent scar tissue formation. As the scar tissue accumulates, the liver loses some of its elasticity and becomes stiffer. Liver elastography involves the use of a surface ultrasound probe that delivers a low frequency pulse or shear wave to a small volume of liver tissue under the rib cage. The transmission of the sound wave is completely painless. How Is a Liver Elastography Performed? The liver is located in the right upper abdomen under the rib cage. Patients are asked to lie flat on an examination table. A technician places the FibroScan probe between the ribs on the right side of the lower chest wall. A series of 10 painless pulses are then applied to the liver. The results are recorded on the equipment and an overall liver stiffness  score is generated. This score is then interpreted by a qualified physician to predict the likelihood of advanced fibrosis or cirrhosis.  Patients are asked to wear loose clothing and should not consume any liquids or solids for a minimum of 4 hours before the test to increase the likelihood of obtaining reliable test results. The scan will take 10 to 15 minutes to complete, but patients should plan on being available for 30 minutes to allow time for preparation   No Alcohol use.   We have sent the following medications to your pharmacy for you to pick up at your convenience: Fluticasone  Avoid NSAIDS  Call in 2 weeks if still with problems   Sitz Bath twice daily x 7 days.    How to Take a [employer] A sitz bath is a warm water bath that may be used to care for your rectum, genital area, or the area between your rectum and genitals (perineum). For a sitz bath, the water only comes up to your hips and covers your buttocks. A sitz bath may done at home in a bathtub or with a portable sitz bath that fits over the toilet. Your health care provider may recommend a sitz bath to help:  Relieve pain and discomfort after delivering a baby.  Relieve pain and itching from hemorrhoids or anal fissures.  Relieve pain after certain surgeries.  Relax muscles that are sore or tight. How to take a sitz bath Take 3-4 sitz baths a day, or as many as told by  your health care provider. Bathtub sitz bath To take a sitz bath in a bathtub: 1. Partially fill a bathtub with warm water. The water should be deep enough to cover your hips and buttocks when you are sitting in the tub. 2. If your health care provider told you to put medicine in the water, follow his or her instructions. 3. Sit in the water. 4. Open the tub drain a little, and leave it open during your bath. 5. Turn on the warm water again, enough to replace the water that is draining out. Keep the water running throughout your bath. This helps  keep the water at the right level and the right temperature. 6. Soak in the water for 15-20 minutes, or as long as told by your health care provider. 7. When you are done, be careful when you stand up. You may feel dizzy. 8. After the sitz bath, pat yourself dry. Do not rub your skin to dry it.  Over-the-toilet sitz bath To take a sitz bath with an over-the-toilet basin: 1. Follow the manufacturer's instructions. 2. Fill the basin with warm water. 3. If your health care provider told you to put medicine in the water, follow his or her instructions. 4. Sit on the seat. Make sure the water covers your buttocks and perineum. 5. Soak in the water for 15-20 minutes, or as long as told by your health care provider. 6. After the sitz bath, pat yourself dry. Do not rub your skin to dry it. 7. Clean and dry the basin between uses. 8. Discard the basin if it cracks, or according to the manufacturer's instructions. Contact a health care provider if:  Your symptoms get worse. Do not continue with sitz baths if your symptoms get worse.  You have new symptoms. If this happens, do not continue with sitz baths until you talk with your health care provider. Summary  A sitz bath is a warm water bath in which the water only comes up to your hips and covers your buttocks.  A sitz bath may help relieve itching, relieve pain, and relax muscles that are sore or tight in the lower part of your body, including your genital area.  Take 3-4 sitz baths a day, or as many as told by your health care provider. Soak in the water for 15-20 minutes.  Do not continue with sitz baths if your symptoms get worse. This information is not intended to replace advice given to you by your health care provider. Make sure you discuss any questions you have with your health care provider. Document Revised: 05/15/2019 Document Reviewed: 12/16/2017 Elsevier Patient Education  [place].  Diabetes Mellitus and Nutrition,  Adult When you have diabetes (diabetes mellitus), it is very important to have healthy eating habits because your blood sugar (glucose) levels are greatly affected by what you eat and drink. Eating healthy foods in the appropriate amounts, at about the same times every day, can help you:  Control your blood glucose.  Lower your risk of heart disease.  Improve your blood pressure.  Reach or maintain a healthy weight. Every person with diabetes is different, and each person has different needs for a meal plan. Your health care provider may recommend that you work with a diet and nutrition specialist (dietitian) to make a meal plan that is best for you. Your meal plan may vary depending on factors such as:  The calories you need.  The medicines you take.  Your weight.  Your blood  glucose, blood pressure, and cholesterol levels.  Your activity level.  Other health conditions you have, such as heart or kidney disease. How do carbohydrates affect me? Carbohydrates, also called carbs, affect your blood glucose level more than any other type of food. Eating carbs naturally raises the amount of glucose in your blood. Carb counting is a method for keeping track of how many carbs you eat. Counting carbs is important to keep your blood glucose at a healthy level, especially if you use insulin or take certain oral diabetes medicines. It is important to know how many carbs you can safely have in each meal. This is different for every person. Your dietitian can help you calculate how many carbs you should have at each meal and for each snack. Foods that contain carbs include:  Bread, cereal, rice, pasta, and crackers.  Potatoes and corn.  Peas, beans, and lentils.  Milk and yogurt.  Fruit and juice.  Desserts, such as cakes, cookies, ice cream, and candy. How does alcohol affect me? Alcohol can cause a sudden decrease in blood glucose (hypoglycemia), especially if you use insulin or take  certain oral diabetes medicines. Hypoglycemia can be a life-threatening condition. Symptoms of hypoglycemia (sleepiness, dizziness, and confusion) are similar to symptoms of having too much alcohol. If your health care provider says that alcohol is safe for you, follow these guidelines:  Limit alcohol intake to no more than 1 drink per day for nonpregnant women and 2 drinks per day for men. One drink equals 12 oz of beer, 5 oz of wine, or 1 oz of hard liquor.  Do not drink on an empty stomach.  Keep yourself hydrated with water, diet soda, or unsweetened iced tea.  Keep in mind that regular soda, juice, and other mixers may contain a lot of sugar and must be counted as carbs. What are tips for following this plan?  Reading food labels  Start by checking the serving size on the "Nutrition Facts" label of packaged foods and drinks. The amount of calories, carbs, fats, and other nutrients listed on the label is based on one serving of the item. Many items contain more than one serving per package.  Check the total grams (g) of carbs in one serving. You can calculate the number of servings of carbs in one serving by dividing the total carbs by 15. For example, if a food has 30 g of total carbs, it would be equal to 2 servings of carbs.  Check the number of grams (g) of saturated and trans fats in one serving. Choose foods that have low or no amount of these fats.  Check the number of milligrams (mg) of salt (sodium) in one serving. Most people should limit total sodium intake to less than 2,300 mg per day.  Always check the nutrition information of foods labeled as "low-fat" or "nonfat". These foods may be higher in added sugar or refined carbs and should be avoided.  Talk to your dietitian to identify your daily goals for nutrients listed on the label. Shopping  Avoid buying canned, premade, or processed foods. These foods tend to be high in fat, sodium, and added sugar.  Shop around the  outside edge of the grocery store. This includes fresh fruits and vegetables, bulk grains, fresh meats, and fresh dairy. Cooking  Use low-heat cooking methods, such as baking, instead of high-heat cooking methods like deep frying.  Cook using healthy oils, such as olive, canola, or sunflower oil.  Avoid cooking with  butter, cream, or high-fat meats. Meal planning  Eat meals and snacks regularly, preferably at the same times every day. Avoid going long periods of time without eating.  Eat foods high in fiber, such as fresh fruits, vegetables, beans, and whole grains. Talk to your dietitian about how many servings of carbs you can eat at each meal.  Eat 4-6 ounces (oz) of lean protein each day, such as lean meat, chicken, fish, eggs, or tofu. One oz of lean protein is equal to: ? 1 oz of meat, chicken, or fish. ? 1 egg. ?  cup of tofu.  Eat some foods each day that contain healthy fats, such as avocado, nuts, seeds, and fish. Lifestyle  Check your blood glucose regularly.  Exercise regularly as told by your health care provider. This may include: ? 150 minutes of moderate-intensity or vigorous-intensity exercise each week. This could be brisk walking, biking, or water aerobics. ? Stretching and doing strength exercises, such as yoga or weightlifting, at least 2 times a week.  Take medicines as told by your health care provider.  Do not use any products that contain nicotine or tobacco, such as cigarettes and e-cigarettes. If you need help quitting, ask your health care provider.  Work with a Social worker or diabetes educator to identify strategies to manage stress and any emotional and social challenges. Questions to ask a health care provider  Do I need to meet with a diabetes educator?  Do I need to meet with a dietitian?  What number can I call if I have questions?  When are the best times to check my blood glucose? Where to find more information:  American Diabetes  Association: diabetes.org  Academy of Nutrition and Dietetics: www.eatright.[employer] of Diabetes and Digestive and Kidney Diseases (NIH): [employer] Summary  A healthy meal plan will help you control your blood glucose and maintain a healthy lifestyle.  Working with a diet and nutrition specialist (dietitian) can help you make a meal plan that is best for you.  Keep in mind that carbohydrates (carbs) and alcohol have immediate effects on your blood glucose levels. It is important to count carbs and to use alcohol carefully. This information is not intended to replace advice given to you by your health care provider. Make sure you discuss any questions you have with your health care provider. Document Revised: 11/26/2017 Document Reviewed: 01/18/2017 Elsevier Patient Education  2020 [place].   Thank you,  Dr. Jackquline Denmark

## 2020-01-18 ENCOUNTER — Other Ambulatory Visit: Payer: Self-pay

## 2020-01-18 ENCOUNTER — Ambulatory Visit (HOSPITAL_COMMUNITY)
Admission: RE | Admit: 2020-01-18 | Discharge: 2020-01-18 | Disposition: A | Discharge status: Home / Self Care | Payer: BC Managed Care – PPO | Source: Ambulatory Visit | Attending: Gastroenterology | Admitting: Gastroenterology

## 2020-01-18 DIAGNOSIS — K760 Fatty (change of) liver, not elsewhere classified: Secondary | ICD-10-CM | POA: Diagnosis not present

## 2020-01-18 MED ORDER — FLUTICASONE PROPIONATE 0.05 % EX CREA: TOPICAL_CREAM | Freq: Two times a day (BID) | CUTANEOUS | 2 refills | Status: AC

## 2020-01-18 NOTE — Addendum Note (Signed)
Addended by: Karena Addison on: 01/18/2020 10:12 AM   Modules accepted: Orders

## 2020-02-10 ENCOUNTER — Ambulatory Visit: Payer: BC Managed Care – PPO

## 2020-02-23 DIAGNOSIS — Z23 Encounter for immunization: Secondary | ICD-10-CM | POA: Diagnosis not present

## 2020-03-01 LAB — HM DIABETES EYE EXAM

## 2020-03-05 DIAGNOSIS — E7800 Pure hypercholesterolemia, unspecified: Secondary | ICD-10-CM | POA: Diagnosis not present

## 2020-03-05 DIAGNOSIS — E1165 Type 2 diabetes mellitus with hyperglycemia: Secondary | ICD-10-CM | POA: Diagnosis not present

## 2020-03-05 DIAGNOSIS — K859 Acute pancreatitis without necrosis or infection, unspecified: Secondary | ICD-10-CM | POA: Diagnosis not present

## 2020-03-05 DIAGNOSIS — G629 Polyneuropathy, unspecified: Secondary | ICD-10-CM | POA: Diagnosis not present

## 2020-03-14 DIAGNOSIS — R7401 Elevation of levels of liver transaminase levels: Secondary | ICD-10-CM | POA: Diagnosis not present

## 2020-03-22 DIAGNOSIS — Z23 Encounter for immunization: Secondary | ICD-10-CM | POA: Diagnosis not present

## 2020-03-25 ENCOUNTER — Other Ambulatory Visit: Payer: BC Managed Care – PPO

## 2020-03-28 ENCOUNTER — Telehealth: Payer: BC Managed Care – PPO | Admitting: Endocrinology

## 2020-04-03 ENCOUNTER — Other Ambulatory Visit: Payer: Self-pay | Admitting: Endocrinology

## 2020-04-05 ENCOUNTER — Other Ambulatory Visit: Payer: Self-pay | Admitting: Endocrinology

## 2020-04-10 ENCOUNTER — Other Ambulatory Visit: Payer: Self-pay

## 2020-04-10 MED ORDER — ONETOUCH VERIO FLEX SYSTEM W/DEVICE KIT: PACK | 0 refills | Status: AC

## 2020-04-10 MED ORDER — ONETOUCH DELICA LANCETS 30G MISC: 1.0000 | Freq: Every day | 2 refills | Status: AC

## 2020-04-10 MED ORDER — GLUCOSE BLOOD VI STRP: ORAL_STRIP | 2 refills | Status: DC

## 2020-04-11 ENCOUNTER — Other Ambulatory Visit: Payer: Self-pay

## 2020-04-11 MED ORDER — GLUCOSE BLOOD VI STRP: 1.0000 | ORAL_STRIP | Freq: Every day | 2 refills | Status: AC

## 2020-08-20 ENCOUNTER — Telehealth: Payer: Self-pay

## 2020-08-20 NOTE — Telephone Encounter (Addendum)
ATC patient today to get him set up for the [facility] Camera Exam here in the office

## 2020-08-29 DIAGNOSIS — E7800 Pure hypercholesterolemia, unspecified: Secondary | ICD-10-CM | POA: Diagnosis not present

## 2020-08-29 DIAGNOSIS — G629 Polyneuropathy, unspecified: Secondary | ICD-10-CM | POA: Diagnosis not present

## 2020-08-29 DIAGNOSIS — E1165 Type 2 diabetes mellitus with hyperglycemia: Secondary | ICD-10-CM | POA: Diagnosis not present

## 2020-09-05 DIAGNOSIS — E1165 Type 2 diabetes mellitus with hyperglycemia: Secondary | ICD-10-CM | POA: Diagnosis not present

## 2020-09-05 DIAGNOSIS — G629 Polyneuropathy, unspecified: Secondary | ICD-10-CM | POA: Diagnosis not present

## 2020-09-05 DIAGNOSIS — R748 Abnormal levels of other serum enzymes: Secondary | ICD-10-CM | POA: Diagnosis not present

## 2020-09-05 DIAGNOSIS — E7800 Pure hypercholesterolemia, unspecified: Secondary | ICD-10-CM | POA: Diagnosis not present

## 2020-09-12 DIAGNOSIS — E7800 Pure hypercholesterolemia, unspecified: Secondary | ICD-10-CM | POA: Diagnosis not present

## 2020-09-12 DIAGNOSIS — Z0000 Encounter for general adult medical examination without abnormal findings: Secondary | ICD-10-CM | POA: Diagnosis not present

## 2020-09-12 DIAGNOSIS — E1140 Type 2 diabetes mellitus with diabetic neuropathy, unspecified: Secondary | ICD-10-CM | POA: Diagnosis not present

## 2020-09-12 DIAGNOSIS — E559 Vitamin D deficiency, unspecified: Secondary | ICD-10-CM | POA: Diagnosis not present

## 2020-09-12 DIAGNOSIS — Z79899 Other long term (current) drug therapy: Secondary | ICD-10-CM | POA: Diagnosis not present

## 2020-09-12 DIAGNOSIS — E119 Type 2 diabetes mellitus without complications: Secondary | ICD-10-CM | POA: Diagnosis not present

## 2020-10-03 DIAGNOSIS — R748 Abnormal levels of other serum enzymes: Secondary | ICD-10-CM | POA: Diagnosis not present

## 2020-10-03 DIAGNOSIS — E1165 Type 2 diabetes mellitus with hyperglycemia: Secondary | ICD-10-CM | POA: Diagnosis not present

## 2020-10-10 DIAGNOSIS — G629 Polyneuropathy, unspecified: Secondary | ICD-10-CM | POA: Diagnosis not present

## 2020-10-10 DIAGNOSIS — E7800 Pure hypercholesterolemia, unspecified: Secondary | ICD-10-CM | POA: Diagnosis not present

## 2020-10-10 DIAGNOSIS — R748 Abnormal levels of other serum enzymes: Secondary | ICD-10-CM | POA: Diagnosis not present

## 2020-10-10 DIAGNOSIS — E1165 Type 2 diabetes mellitus with hyperglycemia: Secondary | ICD-10-CM | POA: Diagnosis not present

## 2020-10-30 IMAGING — US US ABDOMEN COMPLETE W/ ELASTOGRAPHY
2 series · 12 of 25 positions shown · non-contrast
Comparison: 09/07/2019

CLINICAL DATA: Fatty liver, history pancreatitis, cholecystectomy

EXAM:
ULTRASOUND ABDOMEN
ULTRASOUND HEPATIC ELASTOGRAPHY
TECHNIQUE: Sonography of the upper abdomen was performed. In addition,
ultrasound elastography evaluation of the liver was performed. A
region of interest was placed within the right lobe of the liver.
Following application of a compressive sonographic pulse, tissue
compressibility was assessed. Multiple assessments were performed at
the selected site. Median tissue compressibility was determined.
Previously, hepatic stiffness was assessed by shear wave velocity.
Based on recently published Society of Radiologists in Ultrasound
consensus article, reporting is now recommended to be performed in
the SI units of pressure (kiloPascals) representing hepatic
stiffness/elasticity. The obtained result is compared to the
published reference standards. (CACLD = compensated Advanced Chronic
Liver Disease)

[Series 1: us abdomen complete w/ elastography · 10 of 79 slices shown (1 of 2)]
[im 4/79]
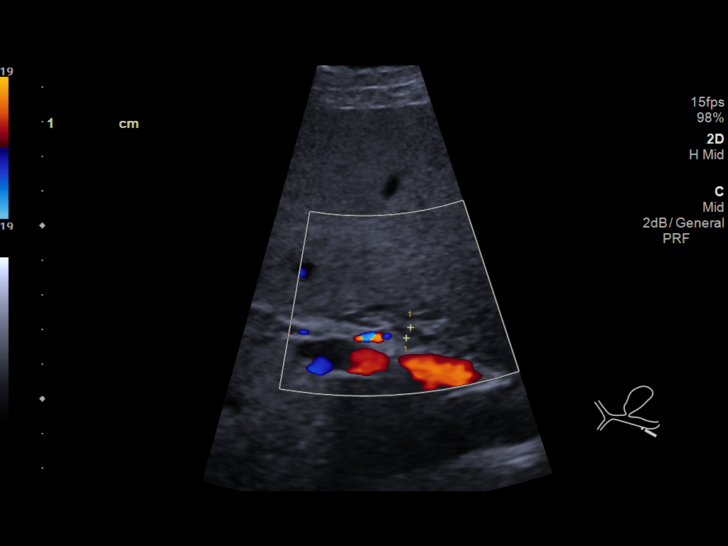
[im 12/79]
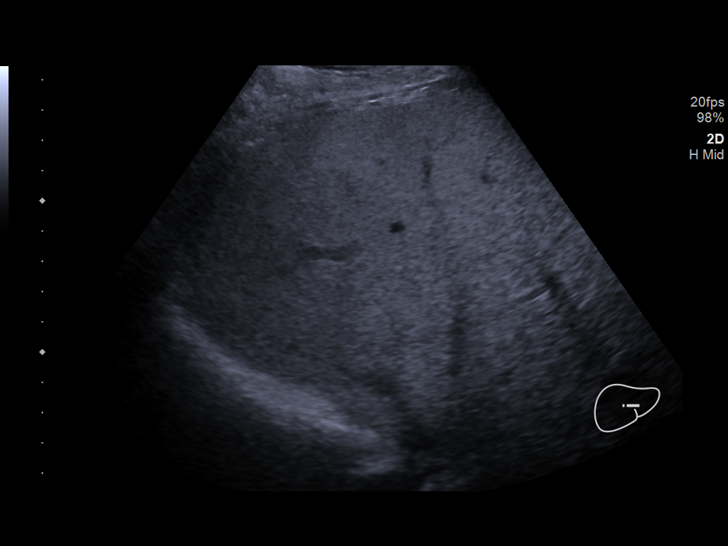
[im 20/79]
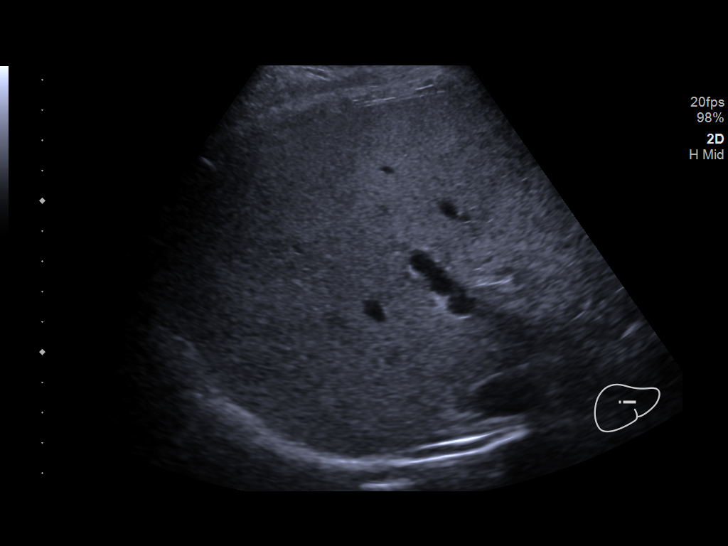
[im 28/79]
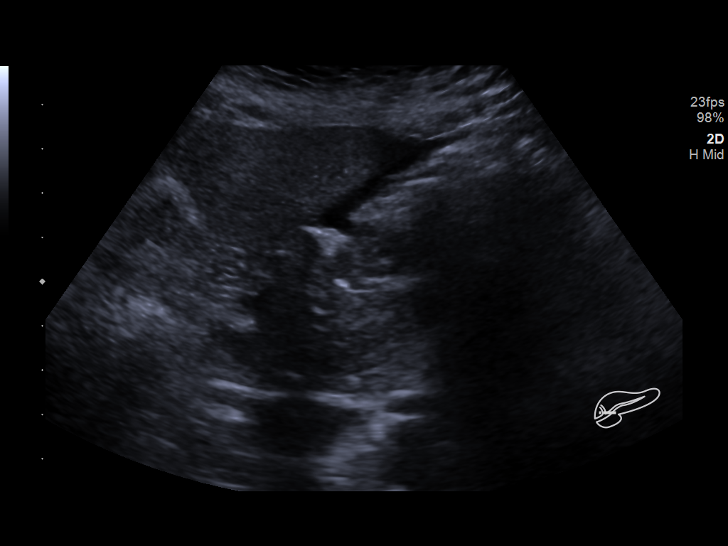
[im 36/79]
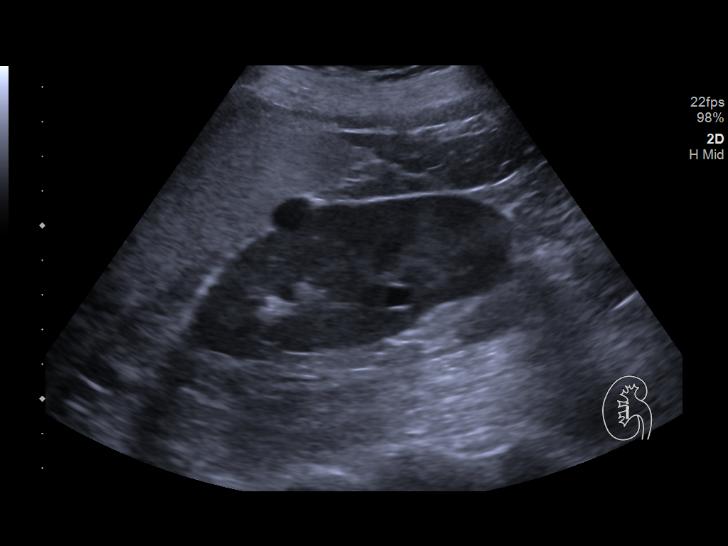
[im 43/79]
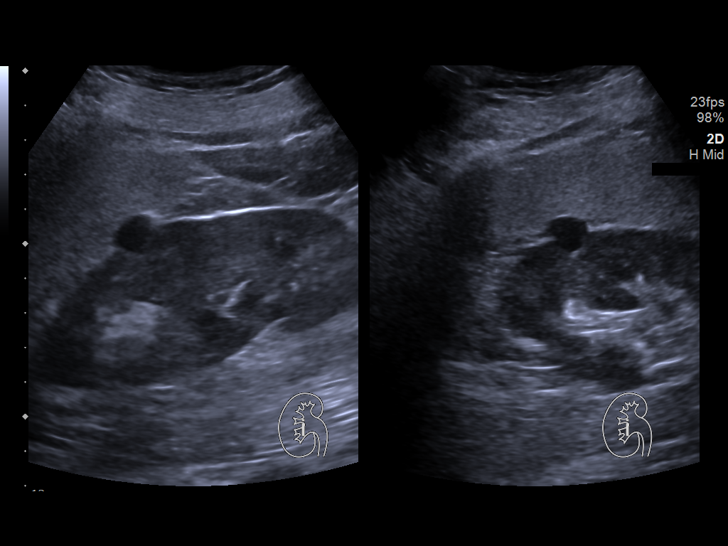
[im 51/79]
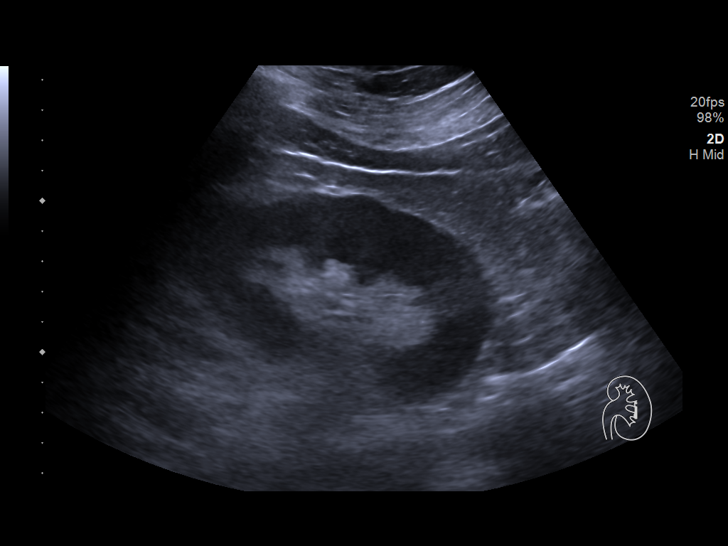
[im 59/79]
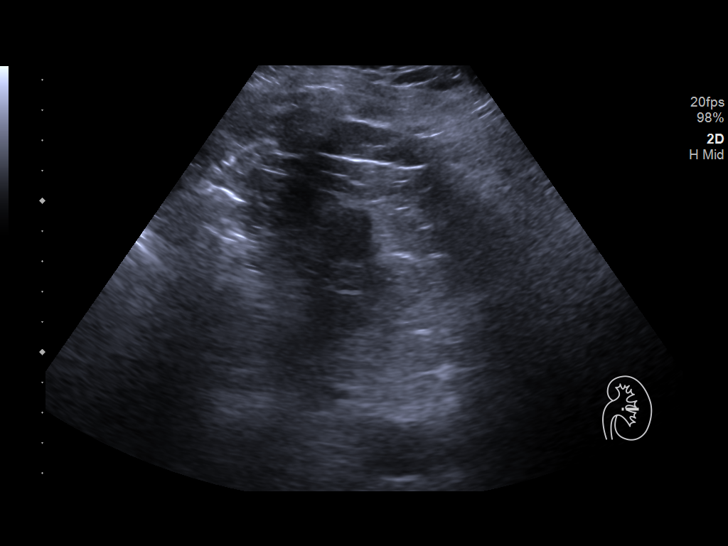
[im 67/79]
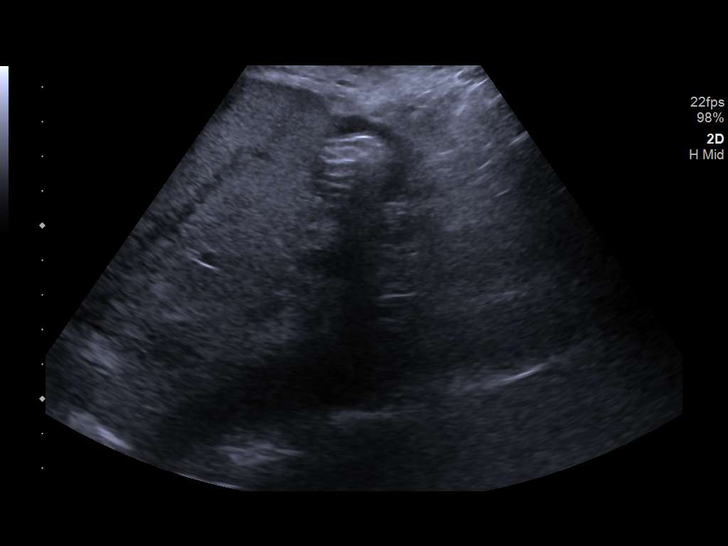
[im 75/79]
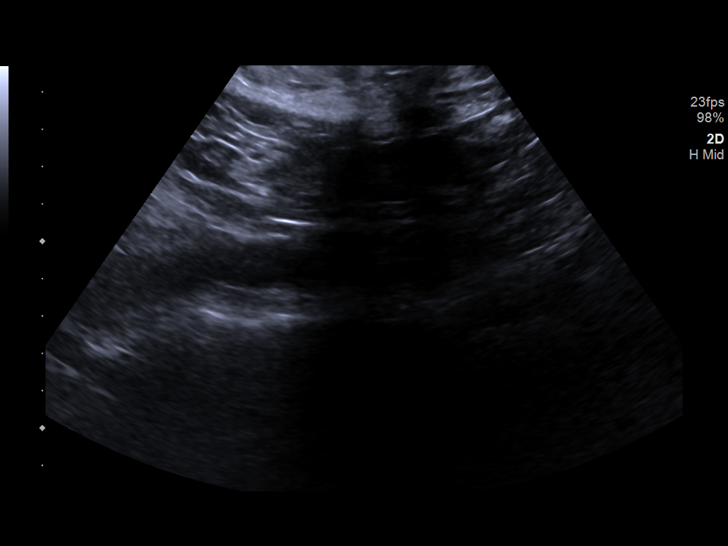

[Series 2: us abdomen complete w/ elastography · 2 of 13 slices shown (2 of 2)]
[im 1/13]
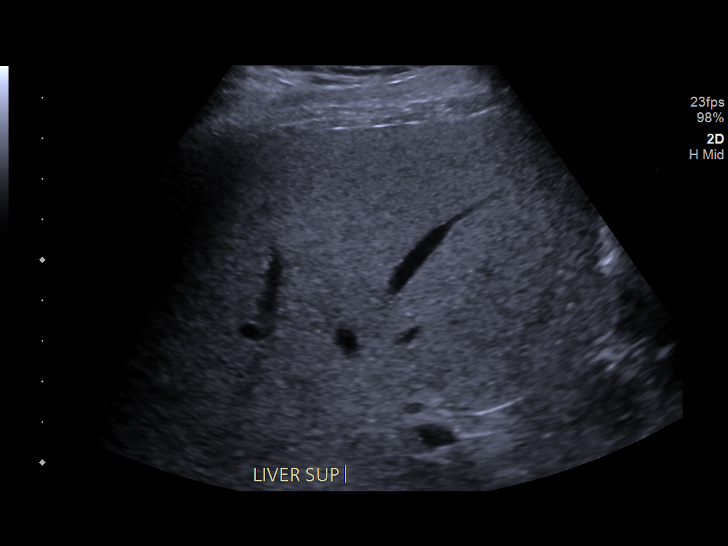
[im 9/13]
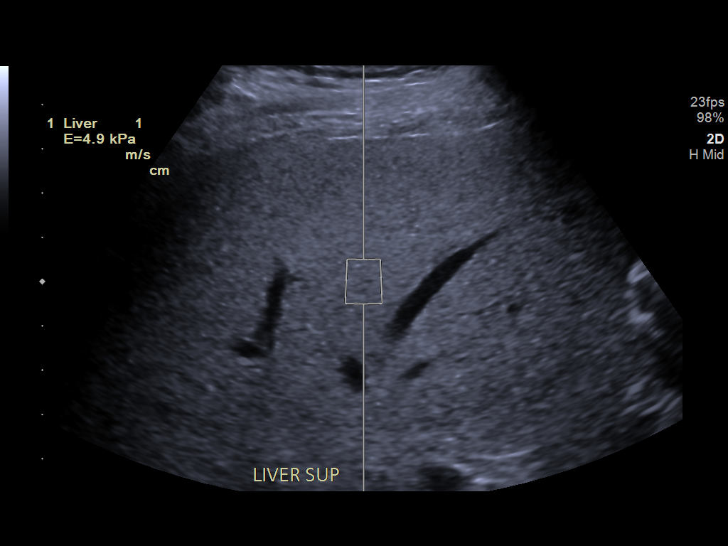

[12 of 25 positions shown; findings below may reference images not displayed]

FINDINGS: ULTRASOUND ABDOMEN

Gallbladder: Surgically absent

Common bile duct: Diameter: 3 mm, normal

Liver: Diffusely increased hepatic echogenicity which can be seen
with cirrhosis, fatty infiltration and certain infiltrative
disorders. No focal hepatic mass or nodularity identified. Portal
vein is patent on color Doppler imaging with normal direction of
blood flow towards the liver.

IVC: Normal appearance

Pancreas: Obscured by bowel gas

Spleen: Normal appearance, 5.1 cm length

Right Kidney: Length: 10.9 cm. Normal cortical thickness and
echogenicity. Small exophytic cyst at lateral mid kidney 14 x 10 x
10 mm. Additional small medial RIGHT renal cyst 11 x 8 x 10 mm. No
solid masses.

Left Kidney: Length: 10.3 cm. Normal morphology without mass or
hydronephrosis.

Abdominal aorta: Normal caliber

Other findings: No free fluid

ULTRASOUND HEPATIC ELASTOGRAPHY

Device: Siemens Helix VTQ

Patient position: Supine

Transducer 5C1

Number of measurements: 10

Hepatic segment:  8

Median kPa: 6.0 kPa

IQR:

IQR/Median kPa ratio:

Data quality:  Good

Diagnostic category: < or = 9 kPa: in the absence of other known
clinical signs, rules out cACLD
IMPRESSION: ULTRASOUND ABDOMEN:

Question fatty infiltration of liver as above.

Post cholecystectomy.

Nonvisualization of pancreas.

ULTRASOUND HEPATIC ELASTOGRAPHY:

Median kPa: 6.0 kPa

Diagnostic category: < or = 9 kPa: in the absence of other known
clinical signs, rules out cACLD

The use of hepatic elastography is applicable to patients with viral
hepatitis and non-alcoholic fatty liver disease. At this time, there
is insufficient data for the referenced cut-off values and use in
other causes of liver disease, including alcoholic liver disease.
Patients, however, may be assessed by elastography and serve as
their own reference standard/baseline.

In patients with non-alcoholic liver disease, the values suggesting
compensated advanced chronic liver disease (cACLD) may be lower, and
patients may need additional testing with elasticity results of [DATE]
kPa.

Please note that abnormal hepatic elasticity and shear wave
velocities may also be identified in clinical settings other than
with hepatic fibrosis, such as: acute hepatitis, elevated right
heart and central venous pressures including use of beta blockers,
Javicito disease ([place]), infiltrative processes such as
mastocytosis/amyloidosis/infiltrative tumor/lymphoma, extrahepatic
cholestasis, with hyperemia in the post-prandial state, and with
liver transplantation. Correlation with patient history, laboratory
data, and clinical condition recommended.

Diagnostic Categories:

< or =5 kPa: high probability of being normal

< or =9 kPa: in the absence of other known clinical signs, rules [DATE] kPa and ?13 kPa: suggestive of cACLD, but needs further testing

>13 kPa: highly suggestive of cACLD

< or =17 kPa: highly suggestive of cACLD with an increased
probability of clinically significant portal hypertension

## 2020-10-31 ENCOUNTER — Other Ambulatory Visit: Payer: Self-pay

## 2020-10-31 ENCOUNTER — Ambulatory Visit (AMBULATORY_SURGERY_CENTER): Payer: BC Managed Care – PPO

## 2020-10-31 ENCOUNTER — Encounter: Payer: Self-pay | Admitting: Gastroenterology

## 2020-10-31 VITALS — Ht 68.0 in | Wt 145.0 lb

## 2020-10-31 DIAGNOSIS — Z1211 Encounter for screening for malignant neoplasm of colon: Secondary | ICD-10-CM

## 2020-10-31 MED ORDER — SUTAB 1479-225-188 MG PO TABS: 1.0000 | ORAL_TABLET | ORAL | 0 refills | Status: DC

## 2020-10-31 NOTE — Progress Notes (Signed)
Pre visit completed via phone; patient verified name, DOB, and address;   No egg or soy allergy known to patient  No issues with past sedation with any surgeries or procedures No intubation problems in the past  No FH of Malignant Hyperthermia No diet pills per patient No home 02 use per patient  No blood thinners per patient  Pt denies issues with constipation  No A fib or A flutter  EMMI video via [place] 19 guidelines implemented in PV today with Pt and RN  Coupon given to pt in PV today , Code to Pharmacy  COVID vaccines series completed x 2 per pt; Due to the COVID-19 pandemic we are asking patients to follow these guidelines. Please only bring one care partner. Please be aware that your care partner may wait in the car in the parking lot or if they feel like they will be too hot to wait in the car, they may wait in the lobby on the 4th floor. All care partners are required to wear a mask the entire time (we do not have any that we can provide them), they need to practice social distancing, and we will do a Covid check for all patient's and care partners when you arrive. Also we will check their temperature and your temperature. If the care partner waits in their car they need to stay in the parking lot the entire time and we will call them on their cell phone when the patient is ready for discharge so they can bring the car to the front of the building. Also all patient's will need to wear a mask into building.

## 2020-11-01 ENCOUNTER — Encounter: Payer: Self-pay | Admitting: Gastroenterology

## 2020-11-06 ENCOUNTER — Encounter: Payer: Self-pay | Admitting: Gastroenterology

## 2020-11-10 ENCOUNTER — Telehealth: Payer: Self-pay | Admitting: Gastroenterology

## 2020-11-10 NOTE — Telephone Encounter (Signed)
Cole Craig,  Can you pl get in touch with [name].  His LFTs have gone up. ?etiology   Pl get -CBC with differential -CMP -GGT -Ammonia -Tylenol level -PT/INR -Fasting lipid profile -Hemoglobin A1c -EtOH level  As soon as possible.  Thanks  RG

## 2020-11-11 ENCOUNTER — Other Ambulatory Visit: Payer: Self-pay | Admitting: Gastroenterology

## 2020-11-11 DIAGNOSIS — R7989 Other specified abnormal findings of blood chemistry: Secondary | ICD-10-CM

## 2020-11-11 NOTE — Telephone Encounter (Signed)
Spoke to patient who will have labs done on 11/13/19 at the [place] office. Patient knows to be fasting for his blood work.

## 2020-11-12 ENCOUNTER — Other Ambulatory Visit: Payer: Self-pay | Admitting: Gastroenterology

## 2020-11-12 ENCOUNTER — Other Ambulatory Visit (INDEPENDENT_AMBULATORY_CARE_PROVIDER_SITE_OTHER): Payer: BC Managed Care – PPO

## 2020-11-12 DIAGNOSIS — R7989 Other specified abnormal findings of blood chemistry: Secondary | ICD-10-CM

## 2020-11-12 LAB — COMPREHENSIVE METABOLIC PANEL
ALT: 140 U/L — ABNORMAL HIGH (ref 0–53)
AST: 163 U/L — ABNORMAL HIGH (ref 0–37)
Albumin: 4.5 g/dL (ref 3.5–5.2)
Alkaline Phosphatase: 134 U/L — ABNORMAL HIGH (ref 39–117)
BUN: 10 mg/dL (ref 6–23)
CO2: 28 mEq/L (ref 19–32)
Calcium: 9.1 mg/dL (ref 8.4–10.5)
Chloride: 98 mEq/L (ref 96–112)
Creatinine, Ser: 0.72 mg/dL (ref 0.40–1.50)
GFR: 107.42 mL/min (ref >60.00)
Glucose, Bld: 155 mg/dL — ABNORMAL HIGH (ref 70–99)
Potassium: 4.2 mEq/L (ref 3.5–5.1)
Sodium: 135 mEq/L (ref 135–145)
Total Bilirubin: 0.8 mg/dL (ref 0.2–1.2)
Total Protein: 7.8 g/dL (ref 6.0–8.3)

## 2020-11-12 LAB — PROTIME-INR
INR: 1.1 ratio — ABNORMAL HIGH (ref 0.8–1.0)
Prothrombin Time: 11.8 s (ref 9.6–13.1)

## 2020-11-12 LAB — CBC WITH DIFFERENTIAL/PLATELET
Basophils Absolute: 0.1 10*3/uL (ref 0.0–0.1)
Basophils Relative: 1.5 % (ref 0.0–3.0)
Eosinophils Absolute: 0.2 10*3/uL (ref 0.0–0.7)
Eosinophils Relative: 2.6 % (ref 0.0–5.0)
HCT: 35.9 % — ABNORMAL LOW (ref 39.0–52.0)
Hemoglobin: 12 g/dL — ABNORMAL LOW (ref 13.0–17.0)
Lymphocytes Relative: 32.1 % (ref 12.0–46.0)
Lymphs Abs: 2 10*3/uL (ref 0.7–4.0)
MCHC: 33.6 g/dL (ref 30.0–36.0)
MCV: 94.8 fl (ref 78.0–100.0)
Monocytes Absolute: 0.4 10*3/uL (ref 0.1–1.0)
Monocytes Relative: 6.8 % (ref 3.0–12.0)
Neutro Abs: 3.6 10*3/uL (ref 1.4–7.7)
Neutrophils Relative %: 57 % (ref 43.0–77.0)
Platelets: 324 10*3/uL (ref 150.0–400.0)
RBC: 3.78 Mil/uL — ABNORMAL LOW (ref 4.22–5.81)
RDW: 11.6 % (ref 11.5–15.5)
WBC: 6.3 10*3/uL (ref 4.0–10.5)

## 2020-11-12 LAB — LIPID PANEL
Cholesterol: 184 mg/dL (ref 0–200)
HDL: 50.5 mg/dL (ref >39.00)
NonHDL: 133.32
Total CHOL/HDL Ratio: 4
Triglycerides: 211 mg/dL — ABNORMAL HIGH (ref 0.0–149.0)
VLDL: 42.2 mg/dL — ABNORMAL HIGH (ref 0.0–40.0)

## 2020-11-12 LAB — HEMOGLOBIN A1C: Hgb A1c MFr Bld: 7.1 % — ABNORMAL HIGH (ref 4.6–6.5)

## 2020-11-12 LAB — LDL CHOLESTEROL, DIRECT: Direct LDL: 110 mg/dL

## 2020-11-12 LAB — GAMMA GT: GGT: 236 U/L — ABNORMAL HIGH (ref 7–51)

## 2020-11-12 LAB — AMMONIA: Ammonia: 34 umol/L (ref 11–35)

## 2020-11-12 NOTE — Progress Notes (Signed)
lipase

## 2020-11-13 ENCOUNTER — Other Ambulatory Visit (INDEPENDENT_AMBULATORY_CARE_PROVIDER_SITE_OTHER): Payer: BC Managed Care – PPO

## 2020-11-13 DIAGNOSIS — R7989 Other specified abnormal findings of blood chemistry: Secondary | ICD-10-CM | POA: Diagnosis not present

## 2020-11-13 LAB — ETHANOL

## 2020-11-13 LAB — LIPASE: Lipase: 31 U/L (ref 11.0–59.0)

## 2020-11-14 ENCOUNTER — Encounter: Payer: BC Managed Care – PPO | Admitting: Gastroenterology

## 2020-11-14 LAB — ACETAMINOPHEN LEVEL: Acetaminophen (Tylenol), Serum: <10 mg/L — ABNORMAL LOW (ref 10–20)

## 2020-11-14 LAB — HEPATITIS B SURFACE ANTIBODY,QUALITATIVE: Hep B S Ab: REACTIVE — AB

## 2020-11-14 LAB — HEPATITIS PANEL, ACUTE
Hep A IgM: NONREACTIVE
Hep B C IgM: NONREACTIVE
Hepatitis B Surface Ag: NONREACTIVE
Hepatitis C Ab: NONREACTIVE
SIGNAL TO CUT-OFF: 0.01 (ref <1.00)

## 2020-11-14 LAB — HEPATITIS A ANTIBODY, TOTAL: Hepatitis A AB,Total: REACTIVE — AB

## 2020-11-27 ENCOUNTER — Other Ambulatory Visit: Payer: Self-pay | Admitting: Gastroenterology

## 2020-11-27 DIAGNOSIS — R7989 Other specified abnormal findings of blood chemistry: Secondary | ICD-10-CM

## 2020-11-27 NOTE — Progress Notes (Signed)
Please inform the patient. LFTs including GGT elevated. I have informed patient and told him to quit drinking all alcohol. Repeat LFTs including GGT in 4 weeks Send report to family physician

## 2020-12-31 ENCOUNTER — Other Ambulatory Visit: Payer: Self-pay

## 2020-12-31 ENCOUNTER — Ambulatory Visit (AMBULATORY_SURGERY_CENTER): Payer: Self-pay | Admitting: *Deleted

## 2020-12-31 VITALS — Ht 68.0 in | Wt 145.0 lb

## 2020-12-31 DIAGNOSIS — Z1211 Encounter for screening for malignant neoplasm of colon: Secondary | ICD-10-CM

## 2020-12-31 MED ORDER — SUTAB 1479-225-188 MG PO TABS: 1.0000 | ORAL_TABLET | Freq: Once | ORAL | 0 refills | Status: AC

## 2020-12-31 NOTE — Progress Notes (Signed)

## 2020-12-31 NOTE — Addendum Note (Signed)
Addended by: Caren Griffins F on: 12/31/2020 11:25 AM   Modules accepted: Level of Service

## 2021-01-07 ENCOUNTER — Other Ambulatory Visit: Payer: Self-pay | Admitting: Gastroenterology

## 2021-01-07 DIAGNOSIS — K760 Fatty (change of) liver, not elsewhere classified: Secondary | ICD-10-CM

## 2021-01-08 ENCOUNTER — Encounter: Payer: Self-pay | Admitting: Gastroenterology

## 2021-01-14 ENCOUNTER — Encounter: Payer: BC Managed Care – PPO | Admitting: Gastroenterology

## 2021-02-03 DIAGNOSIS — E7800 Pure hypercholesterolemia, unspecified: Secondary | ICD-10-CM | POA: Diagnosis not present

## 2021-02-03 DIAGNOSIS — E1165 Type 2 diabetes mellitus with hyperglycemia: Secondary | ICD-10-CM | POA: Diagnosis not present

## 2021-02-21 ENCOUNTER — Other Ambulatory Visit: Payer: Self-pay | Admitting: Gastroenterology

## 2021-02-21 DIAGNOSIS — R7989 Other specified abnormal findings of blood chemistry: Secondary | ICD-10-CM

## 2021-04-04 ENCOUNTER — Other Ambulatory Visit: Payer: Self-pay | Admitting: Endocrinology

## 2021-04-04 ENCOUNTER — Other Ambulatory Visit: Payer: Self-pay

## 2021-04-04 DIAGNOSIS — E1169 Type 2 diabetes mellitus with other specified complication: Secondary | ICD-10-CM

## 2021-04-04 DIAGNOSIS — R7989 Other specified abnormal findings of blood chemistry: Secondary | ICD-10-CM

## 2021-04-04 DIAGNOSIS — K760 Fatty (change of) liver, not elsewhere classified: Secondary | ICD-10-CM

## 2021-04-04 NOTE — Telephone Encounter (Signed)
LOV 11/28/2019.Marland Kitchen Please advise

## 2021-04-04 NOTE — Progress Notes (Signed)
LVM for patient to call back. Patient needs to have lab work week of 4-11 at the [facility] office at the [facility] if possible. He needs to be FASTING for the lab work. He needs all 4 labs in chart drawn.

## 2021-04-06 NOTE — Telephone Encounter (Signed)
Please forward refill request to pt's primary care provider.   

## 2021-04-08 NOTE — Progress Notes (Signed)
Called patient again and left voicemail for patient to call back

## 2021-04-15 ENCOUNTER — Telehealth: Payer: Self-pay

## 2021-04-15 NOTE — Telephone Encounter (Signed)
LVM for patient to call back. He needs to have lab work at the [facility] 2nd floor [address] and he needs to be fasting as well for this

## 2021-04-21 NOTE — Telephone Encounter (Signed)
Called patient and LVM to call back and letter sent

## 2021-05-07 NOTE — Telephone Encounter (Signed)
LVM for patient to call back regarding lab work

## 2021-09-29 DIAGNOSIS — Z23 Encounter for immunization: Secondary | ICD-10-CM | POA: Diagnosis not present

## 2021-09-29 DIAGNOSIS — Z125 Encounter for screening for malignant neoplasm of prostate: Secondary | ICD-10-CM | POA: Diagnosis not present

## 2021-09-29 DIAGNOSIS — E7800 Pure hypercholesterolemia, unspecified: Secondary | ICD-10-CM | POA: Diagnosis not present

## 2021-09-29 DIAGNOSIS — E119 Type 2 diabetes mellitus without complications: Secondary | ICD-10-CM | POA: Diagnosis not present

## 2021-09-29 DIAGNOSIS — E559 Vitamin D deficiency, unspecified: Secondary | ICD-10-CM | POA: Diagnosis not present

## 2021-09-29 DIAGNOSIS — Z0000 Encounter for general adult medical examination without abnormal findings: Secondary | ICD-10-CM | POA: Diagnosis not present

## 2021-09-29 DIAGNOSIS — D649 Anemia, unspecified: Secondary | ICD-10-CM | POA: Diagnosis not present

## 2021-09-30 ENCOUNTER — Other Ambulatory Visit (HOSPITAL_BASED_OUTPATIENT_CLINIC_OR_DEPARTMENT_OTHER): Payer: Self-pay | Admitting: Family Medicine

## 2021-09-30 DIAGNOSIS — E7800 Pure hypercholesterolemia, unspecified: Secondary | ICD-10-CM

## 2021-10-06 DIAGNOSIS — J22 Unspecified acute lower respiratory infection: Secondary | ICD-10-CM | POA: Diagnosis not present

## 2021-10-06 DIAGNOSIS — U071 COVID-19: Secondary | ICD-10-CM | POA: Diagnosis not present

## 2021-10-07 ENCOUNTER — Ambulatory Visit (HOSPITAL_BASED_OUTPATIENT_CLINIC_OR_DEPARTMENT_OTHER): Payer: BC Managed Care – PPO

## 2021-10-15 ENCOUNTER — Other Ambulatory Visit: Payer: Self-pay

## 2021-10-15 ENCOUNTER — Ambulatory Visit (HOSPITAL_BASED_OUTPATIENT_CLINIC_OR_DEPARTMENT_OTHER)
Admission: RE | Admit: 2021-10-15 | Discharge: 2021-10-15 | Disposition: A | Discharge status: Home / Self Care | Payer: Self-pay | Source: Ambulatory Visit | Attending: Family Medicine | Admitting: Family Medicine

## 2021-10-15 DIAGNOSIS — E7800 Pure hypercholesterolemia, unspecified: Secondary | ICD-10-CM | POA: Insufficient documentation

## 2021-11-24 ENCOUNTER — Other Ambulatory Visit: Payer: Self-pay | Admitting: Gastroenterology

## 2021-11-24 DIAGNOSIS — R7989 Other specified abnormal findings of blood chemistry: Secondary | ICD-10-CM

## 2021-12-02 ENCOUNTER — Other Ambulatory Visit: Payer: Self-pay

## 2021-12-02 ENCOUNTER — Encounter: Payer: Self-pay | Admitting: Gastroenterology

## 2021-12-02 ENCOUNTER — Ambulatory Visit (AMBULATORY_SURGERY_CENTER): Payer: 59

## 2021-12-02 VITALS — Ht 67.0 in | Wt 142.0 lb

## 2021-12-02 DIAGNOSIS — Z1211 Encounter for screening for malignant neoplasm of colon: Secondary | ICD-10-CM

## 2021-12-02 NOTE — Progress Notes (Signed)
      Patient's pre-visit was done today over the phone with the patient   Name,DOB and address verified.   Patient denies any allergies to Eggs and Soy.  Patient denies any problems with anesthesia/sedation. Patient denies taking diet pills or blood thinners.  Denies atrial flutter or atrial fib Denies chronic constipation No home Oxygen.   Packet of Prep instructions mailed to patient including a copy of a consent form-pt is aware.  Patient understands to call us back with any questions or concerns.  Patient is aware of our care-partner policy and [id number] safety protocol.   EMMI education assigned to the patient for the procedure, sent to [place].   The patient is COVID-19 vaccinated.      Pt has sutabs on hand and in date

## 2021-12-09 ENCOUNTER — Telehealth: Payer: Self-pay | Admitting: Gastroenterology

## 2021-12-09 NOTE — Telephone Encounter (Signed)
Dx with FLU I told him to hold off colon for now RG

## 2021-12-09 NOTE — Telephone Encounter (Signed)
Appointment has been cancelled.

## 2021-12-11 ENCOUNTER — Encounter: Payer: Self-pay | Admitting: Gastroenterology

## 2022-01-27 DIAGNOSIS — E1065 Type 1 diabetes mellitus with hyperglycemia: Secondary | ICD-10-CM | POA: Diagnosis not present

## 2022-02-03 DIAGNOSIS — E7800 Pure hypercholesterolemia, unspecified: Secondary | ICD-10-CM | POA: Diagnosis not present

## 2022-02-03 DIAGNOSIS — E139 Other specified diabetes mellitus without complications: Secondary | ICD-10-CM | POA: Diagnosis not present

## 2022-04-21 ENCOUNTER — Encounter: Payer: Self-pay | Admitting: Gastroenterology

## 2022-05-13 ENCOUNTER — Other Ambulatory Visit: Payer: Self-pay | Admitting: Gastroenterology

## 2022-05-13 ENCOUNTER — Ambulatory Visit (AMBULATORY_SURGERY_CENTER): Payer: 59

## 2022-05-13 VITALS — Ht 69.0 in | Wt 145.0 lb

## 2022-05-13 DIAGNOSIS — Z1211 Encounter for screening for malignant neoplasm of colon: Secondary | ICD-10-CM

## 2022-05-13 MED ORDER — SUTAB 1479-225-188 MG PO TABS: 1.0000 | ORAL_TABLET | ORAL | 0 refills | Status: DC

## 2022-05-13 NOTE — Progress Notes (Signed)
No egg or soy allergy known to patient  ?No issues known to pt with past sedation with any surgeries or procedures ?Patient denies ever being told they had issues or difficulty with intubation  ?No FH of Malignant Hyperthermia ?Pt is not on diet pills ?Pt is not on home 02  ?Pt is not on blood thinners  ?Pt denies issues with constipation at this time; ?No A fib or A flutter ?Coupon to pt in PV today, Code to Pharmacy and NO PA's for preps discussed with pt in PV today  ?Discussed with pt there will be an out-of-pocket cost for prep and that varies from $0 to 70 + dollars - pt verbalized understanding  ?Pt instructed to use Singlecare.com or GoodRx for a price reduction on prep  ?PV completed over the phone. Pt verified name, DOB, address and insurance during PV today.  ?Pt mailed instruction packet with copy of consent form to read and not return, and instructions.  ?Pt encouraged to call with questions or issues.  ?Pt has My chart, procedure instructions sent via My Chart  ?Insurance confirmed with pt at [facility] today  ? ?

## 2022-05-14 DIAGNOSIS — E1165 Type 2 diabetes mellitus with hyperglycemia: Secondary | ICD-10-CM | POA: Diagnosis not present

## 2022-05-14 DIAGNOSIS — E139 Other specified diabetes mellitus without complications: Secondary | ICD-10-CM | POA: Diagnosis not present

## 2022-06-07 ENCOUNTER — Encounter: Payer: Self-pay | Admitting: Certified Registered Nurse Anesthetist

## 2022-06-10 ENCOUNTER — Encounter: Payer: Self-pay | Admitting: Gastroenterology

## 2022-06-11 ENCOUNTER — Ambulatory Visit (AMBULATORY_SURGERY_CENTER): Payer: BC Managed Care – PPO | Admitting: Gastroenterology

## 2022-06-11 ENCOUNTER — Encounter: Payer: Self-pay | Admitting: Gastroenterology

## 2022-06-11 VITALS — BP 119/82 | HR 75 | Temp 97.8°F | Resp 16 | Ht 69.0 in | Wt 145.0 lb

## 2022-06-11 DIAGNOSIS — Z1211 Encounter for screening for malignant neoplasm of colon: Secondary | ICD-10-CM | POA: Diagnosis not present

## 2022-06-11 MED ORDER — SODIUM CHLORIDE 0.9 % IV SOLN: 500.0000 mL | Freq: Once | INTRAVENOUS | Status: AC

## 2022-06-11 MED ORDER — HYDROCORTISONE ACETATE 25 MG RE SUPP: 25.0000 mg | Freq: Every day | RECTAL | 2 refills | Status: DC

## 2022-06-11 NOTE — Progress Notes (Signed)
Report given to PACU, vss 

## 2022-06-11 NOTE — Patient Instructions (Signed)
YOU HAD AN ENDOSCOPIC PROCEDURE TODAY AT THE [facility]:   Refer to the procedure report that was given to you for any specific questions about what was found during the examination.  If the procedure report does not answer your questions, please call your gastroenterologist to clarify.  If you requested that your care partner not be given the details of your procedure findings, then the procedure report has been included in a sealed envelope for you to review at your convenience later.  YOU SHOULD EXPECT: Some feelings of bloating in the abdomen. Passage of more gas than usual.  Walking can help get rid of the air that was put into your GI tract during the procedure and reduce the bloating. If you had a lower endoscopy (such as a colonoscopy or flexible sigmoidoscopy) you may notice spotting of blood in your stool or on the toilet paper. If you underwent a bowel prep for your procedure, you may not have a normal bowel movement for a few days.  Please Note:  You might notice some irritation and congestion in your nose or some drainage.  This is from the oxygen used during your procedure.  There is no need for concern and it should clear up in a day or so.  SYMPTOMS TO REPORT IMMEDIATELY:  Following lower endoscopy (colonoscopy or flexible sigmoidoscopy):  Excessive amounts of blood in the stool  Significant tenderness or worsening of abdominal pains  Swelling of the abdomen that is new, acute  Fever of 100F or higher  For urgent or emergent issues, a gastroenterologist can be reached at any hour by calling [phone]. Do not use MyChart messaging for urgent concerns.    DIET:  We do recommend a small meal at first, but then you may proceed to your regular diet.  Drink plenty of fluids but you should avoid alcoholic beverages for 24 hours.  ACTIVITY:  You should plan to take it easy for the rest of today and you should NOT DRIVE or use heavy machinery until tomorrow (because of  the sedation medicines used during the test).    FOLLOW UP: Our staff will call the number listed on your records 24-72 hours following your procedure to check on you and address any questions or concerns that you may have regarding the information given to you following your procedure. If we do not reach you, we will leave a message.  We will attempt to reach you two times.  During this call, we will ask if you have developed any symptoms of COVID 19. If you develop any symptoms (ie: fever, flu-like symptoms, shortness of breath, cough etc.) before then, please call [phone].  If you test positive for Covid 19 in the 2 weeks post procedure, please call and report this information to us.     SIGNATURES/CONFIDENTIALITY: You and/or your care partner have signed paperwork which will be entered into your electronic medical record.  These signatures attest to the fact that that the information above on your After Visit Summary has been reviewed and is understood.  Full responsibility of the confidentiality of this discharge information lies with you and/or your care-partner.  

## 2022-06-11 NOTE — Progress Notes (Signed)
Vs in adm by CW 

## 2022-06-11 NOTE — Op Note (Signed)
[facility] Patient Name: Cole Craig Procedure Date: 06/11/2022 11:05 AM MRN: [id number] Endoscopist: Jackquline Denmark , MD Age: 51 Referring MD:  Date of Birth: [birth year] Gender: Male Account #: [id number] Procedure:                Colonoscopy Indications:              Screening for colorectal malignant neoplasm Medicines:                Monitored Anesthesia Care Procedure:                Pre-Anesthesia Assessment:                           - Prior to the procedure, a History and Physical                            was performed, and patient medications and                            allergies were reviewed. The patient's tolerance of                            previous anesthesia was also reviewed. The risks                            and benefits of the procedure and the sedation                            options and risks were discussed with the patient.                            All questions were answered, and informed consent                            was obtained. Prior Anticoagulants: The patient has                            taken no previous anticoagulant or antiplatelet                            agents. ASA Grade Assessment: II - A patient with                            mild systemic disease. After reviewing the risks                            and benefits, the patient was deemed in                            satisfactory condition to undergo the procedure.                           After obtaining informed consent, the colonoscope  was passed under direct vision. Throughout the                            procedure, the patient's blood pressure, pulse, and                            oxygen saturations were monitored continuously. The                            Olympus PCF-H190DL ([id number]) Colonoscope was                            introduced through the anus and advanced to the 2                            cm into the ileum. The  colonoscopy was performed                            without difficulty. The patient tolerated the                            procedure well. The quality of the bowel                            preparation was good. The terminal ileum, ileocecal                            valve, appendiceal orifice, and rectum were                            photographed. Scope In: 11:11:12 AM Scope Out: 11:22:32 AM Scope Withdrawal Time: 0 hours 8 minutes 32 seconds  Total Procedure Duration: 0 hours 11 minutes 20 seconds  Findings:                 The colon (entire examined portion) appeared normal.                           Non-bleeding internal hemorrhoids were found during                            retroflexion and during perianal exam. The                            hemorrhoids were overall small except for 1 channel                            of Grade III internal hemorrhoids.                           The terminal ileum appeared normal.                           The exam was otherwise without abnormality on  direct and retroflexion views. Complications:            No immediate complications. Estimated Blood Loss:     Estimated blood loss: none. Impression:               - The entire examined colon is normal.                           - Non-bleeding internal hemorrhoids.                           - The examined portion of the ileum was normal.                           - The examination was otherwise normal on direct                            and retroflexion views.                           - No specimens collected. Recommendation:           - Patient has a contact number available for                            emergencies. The signs and symptoms of potential                            delayed complications were discussed with the                            patient. Return to normal activities tomorrow.                            Written discharge instructions were  provided to the                            patient.                           - Resume previous diet.                           - Continue present medications.                           - Repeat colonoscopy in 10 years for screening                            purposes. Earlier, if with any new problems or                            change in family history.                           - Use hydrocortisone suppository 25 mg 1 per rectum  once a day for 10 days 2RF                           - The findings and recommendations were discussed                            with the patient's family. Jackquline Denmark, MD 06/11/2022 11:28:40 AM This report has been signed electronically.

## 2022-06-11 NOTE — Progress Notes (Signed)
[facility] History and Physical   Primary Care Physician:  Lujean Amel, MD   Reason for Procedure:   CRC screeing   Plan:    colon     HPI: Cole Craig is a 51 y.o. male  No nausea, vomiting, heartburn, regurgitation, odynophagia or dysphagia.  No significant diarrhea or constipation.  No melena or hematochezia. No unintentional weight loss. No abdominal pain.   Past Medical History:  Diagnosis Date   Diabetes mellitus    on meds   GERD (gastroesophageal reflux disease)    on meds   Hepatitis B    Hyperlipidemia    on meds   Pancreatic cystadenoma    Pancreatitis    Vitamin D deficiency    on meds    Past Surgical History:  Procedure Laterality Date   CHOLECYSTECTOMY     circa 2003 in [place], [place]    incisional and inguinal hernia   pancreatitis surgery      circa 2003 in [place], [place]    Prior to Admission medications   Medication Sig Start Date End Date Taking? Authorizing Provider  Alpha-Lipoic Acid 200 MG CAPS Take 1 capsule by mouth daily at 6 (six) AM.   Yes [provider]  ammonium lactate (AMLACTIN) 12 % cream 1 application 3/66/44  Yes [provider]  BD PEN NEEDLE NANO 2ND GEN 32G X 4 MM MISC  02/23/22  Yes [provider]  Blood Glucose Monitoring Suppl ([place]urtis) w/Device KIT Use OneTouch Verio Flex to check blood sugar once daily. DX:E11.40 04/10/20  Yes Elayne Snare, MD  glimepiride (AMARYL) 1 MG tablet TAKE 1 TABLET AT DINNER    DAILY 04/03/20  Yes Elayne Snare, MD  glucose blood test strip 1 each by Other route daily. Use OneTouch verio test strips to check blood sugar daily. DX:E11.40 04/11/20  Yes Elayne Snare, MD  insulin aspart (NOVOLOG FLEXPEN) 100 UNIT/ML FlexPen 3-5units 08/14/21  Yes [provider]  OneTouch Delica Lancets 03K MISC 1 each by Does not apply route daily. Use lancets to check blood sugar once daily. DX:E11.40 04/10/20  Yes Elayne Snare,  MD  pantoprazole (PROTONIX) 40 MG tablet TAKE 1 TABLET DAILY 04/03/20  Yes Elayne Snare, MD  pioglitazone (ACTOS) 30 MG tablet Take 1 tablet (30 mg total) by mouth daily. 09/28/19  Yes Elayne Snare, MD  rosuvastatin (CRESTOR) 20 MG tablet Take 20 mg by mouth at bedtime. 08/23/20  Yes [provider]  B Complex Vitamins (VITAMIN B COMPLEX PO) Take 1 tablet by mouth daily.    [provider]  Cholecalciferol (VITAMIN D3) 2000 UNITS TABS Take 2,000 Units by mouth daily.  Patient not taking: Reported on 06/11/2022    [provider]  insulin degludec (TRESIBA FLEXTOUCH) 100 UNIT/ML FlexTouch Pen Inject 5 Units into the skin daily.    [provider]  metFORMIN (GLUCOPHAGE-XR) 750 MG 24 hr tablet TAKE 2 TABLETS (1,500MG    TOTAL) DAILY WITH BREAKFAST 04/03/20   Elayne Snare, MD    Current Outpatient Medications  Medication Sig Dispense Refill   Alpha-Lipoic Acid 200 MG CAPS Take 1 capsule by mouth daily at 6 (six) AM.     ammonium lactate (AMLACTIN) 12 % cream 1 application     BD PEN NEEDLE NANO 2ND GEN 32G X 4 MM MISC      Blood Glucose Monitoring Suppl (ONETOUCH VERIO FLEX SYSTEM) w/Device KIT Use OneTouch Verio Flex to  check blood sugar once daily. DX:E11.40 1 kit 0   glimepiride (AMARYL) 1 MG tablet TAKE 1 TABLET AT DINNER    DAILY 90 tablet 2   glucose blood test strip 1 each by Other route daily. Use OneTouch verio test strips to check blood sugar daily. DX:E11.40 100 each 2   insulin aspart (NOVOLOG FLEXPEN) 100 UNIT/ML FlexPen 3-7JIRCV     OneTouch Delica Lancets 89F MISC 1 each by Does not apply route daily. Use lancets to check blood sugar once daily. DX:E11.40 100 each 2   pantoprazole (PROTONIX) 40 MG tablet TAKE 1 TABLET DAILY 90 tablet 2   pioglitazone (ACTOS) 30 MG tablet Take 1 tablet (30 mg total) by mouth daily. 90 tablet 2   rosuvastatin (CRESTOR) 20 MG tablet Take 20 mg by mouth at bedtime.     B Complex Vitamins (VITAMIN B COMPLEX PO) Take 1 tablet  by mouth daily.     Cholecalciferol (VITAMIN D3) 2000 UNITS TABS Take 2,000 Units by mouth daily.  (Patient not taking: Reported on 06/11/2022)     insulin degludec (TRESIBA FLEXTOUCH) 100 UNIT/ML FlexTouch Pen Inject 5 Units into the skin daily.     metFORMIN (GLUCOPHAGE-XR) 750 MG 24 hr tablet TAKE 2 TABLETS (1,500MG    TOTAL) DAILY WITH BREAKFAST 180 tablet 2   Current Facility-Administered Medications  Medication Dose Route Frequency Provider Last Rate Last Admin   0.9 %  sodium chloride infusion  500 mL Intravenous Once Jackquline Denmark, MD        Allergies as of 06/11/2022 - Review Complete 06/11/2022  Allergen Reaction Noted   Iodine Hives, Itching, and Rash 02/03/2022   Iohexol Hives, Swelling, and Rash 07/05/2007   Ivp dye [iodinated contrast media] Hives, Itching, and Rash 10/12/2014    Family History  Problem Relation Age of Onset   Throat cancer Father    Esophageal cancer Father    Diabetes Mother    Diabetes Brother    Arthritis Sister    Colon cancer Neg Hx    Colon polyps Neg Hx    Stomach cancer Neg Hx    Rectal cancer Neg Hx     Social History   Socioeconomic History   Marital status: Married    Spouse name: Not on file   Number of children: 2   Years of education: college   Highest education level: Not on file  Occupational History   Occupation: Systems developer    Comment: self employed    Employer: [id number]  Tobacco Use   Smoking status: Former    Types: Cigars    Quit date: 12/29/2011    Years since quitting: 10.4   Smokeless tobacco: Never  Vaping Use   Vaping Use: Never used  Substance and Sexual Activity   Alcohol use: Not Currently    Alcohol/week: 0.0 - 4.0 standard drinks of alcohol    Comment: occ   Drug use: No   Sexual activity: Never  Other Topics Concern   Not on file  Social History Narrative   Patient lives at home with his wife Ardyth Man).   Patient works full time at [employer].   Education college    Right handed.   Caffeine  two cups daily.         Social Determinants of Health   Financial Resource Strain: Not on file  Food Insecurity: Not on file  Transportation Needs: Not on file  Physical Activity: Not on file  Stress: Not on file  Social Connections: Not on file  Intimate Partner Violence: Not on file    Review of Systems: Positive for none All other review of systems negative except as mentioned in the HPI.  Physical Exam: Vital signs in last 24 hours: '@VSRANGES' @   General:   Alert,  Well-developed, well-nourished, pleasant and cooperative in NAD Lungs:  Clear throughout to auscultation.   Heart:  Regular rate and rhythm; no murmurs, clicks, rubs,  or gallops. Abdomen:  Soft, nontender and nondistended. Normal bowel sounds.   Neuro/Psych:  Alert and cooperative. Normal mood and affect. A and O x 3    No significant changes were identified.  The patient continues to be an appropriate candidate for the planned procedure and anesthesia.   Carmell Austria, MD. [facility] 06/11/2022 11:02 AM@

## 2022-06-12 ENCOUNTER — Telehealth: Payer: Self-pay

## 2022-06-12 MED ORDER — HYDROCORTISONE ACETATE 25 MG RE SUPP: 25.0000 mg | Freq: Every day | RECTAL | 2 refills | Status: AC

## 2022-06-12 NOTE — Telephone Encounter (Signed)
  Follow up Call-     06/11/2022   10:31 AM  Call back number  Post procedure Call Back phone  # [phone]  Permission to leave phone message Yes     Patient questions:  Do you have a fever, pain , or abdominal swelling? No. Pain Score  0 *  Have you tolerated food without any problems? Yes.    Have you been able to return to your normal activities? Yes.    Do you have any questions about your discharge instructions: Diet   No. Medications  No. Follow up visit  No.  Do you have questions or concerns about your Care? No.  Actions:2 * If pain score is 4 or above: No action needed, pain <4.

## 2022-06-12 NOTE — Telephone Encounter (Signed)
  Follow up Call-     06/11/2022   10:31 AM  Call back number  Post procedure Call Back phone  # [phone]  Permission to leave phone message Yes     Patient questions:  Do you have a fever, pain , or abdominal swelling? No. Pain Score  0 *  Have you tolerated food without any problems? Yes.    Have you been able to return to your normal activities? Yes.    Do you have any questions about your discharge instructions: Diet   No. Medications  No. Follow up visit  No.  Do you have questions or concerns about your Care? No.  Actions: * If pain score is 4 or above: No action needed, pain <4.

## 2022-09-14 DIAGNOSIS — E139 Other specified diabetes mellitus without complications: Secondary | ICD-10-CM | POA: Diagnosis not present

## 2022-10-07 DIAGNOSIS — Z23 Encounter for immunization: Secondary | ICD-10-CM | POA: Diagnosis not present

## 2022-10-07 DIAGNOSIS — E7800 Pure hypercholesterolemia, unspecified: Secondary | ICD-10-CM | POA: Diagnosis not present

## 2022-10-07 DIAGNOSIS — E119 Type 2 diabetes mellitus without complications: Secondary | ICD-10-CM | POA: Diagnosis not present

## 2022-10-07 DIAGNOSIS — Z125 Encounter for screening for malignant neoplasm of prostate: Secondary | ICD-10-CM | POA: Diagnosis not present

## 2022-10-07 DIAGNOSIS — Z0000 Encounter for general adult medical examination without abnormal findings: Secondary | ICD-10-CM | POA: Diagnosis not present

## 2022-10-20 DIAGNOSIS — H25811 Combined forms of age-related cataract, right eye: Secondary | ICD-10-CM | POA: Diagnosis not present

## 2022-10-24 ENCOUNTER — Other Ambulatory Visit: Payer: Self-pay | Admitting: Gastroenterology

## 2022-11-05 DIAGNOSIS — H25811 Combined forms of age-related cataract, right eye: Secondary | ICD-10-CM | POA: Diagnosis not present

## 2022-11-05 DIAGNOSIS — H269 Unspecified cataract: Secondary | ICD-10-CM | POA: Diagnosis not present

## 2022-11-05 DIAGNOSIS — E119 Type 2 diabetes mellitus without complications: Secondary | ICD-10-CM | POA: Diagnosis not present

## 2022-11-05 DIAGNOSIS — H2511 Age-related nuclear cataract, right eye: Secondary | ICD-10-CM | POA: Diagnosis not present

## 2022-12-17 DIAGNOSIS — H01001 Unspecified blepharitis right upper eyelid: Secondary | ICD-10-CM | POA: Diagnosis not present

## 2023-01-22 DIAGNOSIS — N481 Balanitis: Secondary | ICD-10-CM | POA: Diagnosis not present

## 2023-01-25 DIAGNOSIS — E7800 Pure hypercholesterolemia, unspecified: Secondary | ICD-10-CM | POA: Diagnosis not present

## 2023-01-25 DIAGNOSIS — K859 Acute pancreatitis without necrosis or infection, unspecified: Secondary | ICD-10-CM | POA: Diagnosis not present

## 2023-01-25 DIAGNOSIS — R748 Abnormal levels of other serum enzymes: Secondary | ICD-10-CM | POA: Diagnosis not present

## 2023-01-25 DIAGNOSIS — E1165 Type 2 diabetes mellitus with hyperglycemia: Secondary | ICD-10-CM | POA: Diagnosis not present

## 2023-01-25 DIAGNOSIS — E139 Other specified diabetes mellitus without complications: Secondary | ICD-10-CM | POA: Diagnosis not present

## 2023-04-06 DIAGNOSIS — M7501 Adhesive capsulitis of right shoulder: Secondary | ICD-10-CM | POA: Diagnosis not present

## 2023-04-13 DIAGNOSIS — M7501 Adhesive capsulitis of right shoulder: Secondary | ICD-10-CM | POA: Diagnosis not present

## 2023-04-19 DIAGNOSIS — M7501 Adhesive capsulitis of right shoulder: Secondary | ICD-10-CM | POA: Diagnosis not present

## 2023-04-28 DIAGNOSIS — K859 Acute pancreatitis without necrosis or infection, unspecified: Secondary | ICD-10-CM | POA: Diagnosis not present

## 2023-04-28 DIAGNOSIS — E139 Other specified diabetes mellitus without complications: Secondary | ICD-10-CM | POA: Diagnosis not present

## 2023-04-28 DIAGNOSIS — R748 Abnormal levels of other serum enzymes: Secondary | ICD-10-CM | POA: Diagnosis not present

## 2023-04-28 DIAGNOSIS — E7800 Pure hypercholesterolemia, unspecified: Secondary | ICD-10-CM | POA: Diagnosis not present

## 2023-04-29 DIAGNOSIS — N481 Balanitis: Secondary | ICD-10-CM | POA: Diagnosis not present

## 2023-04-29 DIAGNOSIS — R300 Dysuria: Secondary | ICD-10-CM | POA: Diagnosis not present

## 2023-05-06 DIAGNOSIS — M25511 Pain in right shoulder: Secondary | ICD-10-CM | POA: Diagnosis not present

## 2023-05-06 DIAGNOSIS — M7501 Adhesive capsulitis of right shoulder: Secondary | ICD-10-CM | POA: Diagnosis not present

## 2023-09-09 ENCOUNTER — Other Ambulatory Visit (HOSPITAL_BASED_OUTPATIENT_CLINIC_OR_DEPARTMENT_OTHER): Payer: Self-pay | Admitting: Endocrinology

## 2023-09-09 DIAGNOSIS — E139 Other specified diabetes mellitus without complications: Secondary | ICD-10-CM | POA: Diagnosis not present

## 2023-09-09 DIAGNOSIS — E7800 Pure hypercholesterolemia, unspecified: Secondary | ICD-10-CM | POA: Diagnosis not present

## 2023-09-09 DIAGNOSIS — Z23 Encounter for immunization: Secondary | ICD-10-CM | POA: Diagnosis not present

## 2023-09-09 DIAGNOSIS — G629 Polyneuropathy, unspecified: Secondary | ICD-10-CM | POA: Diagnosis not present

## 2023-09-09 DIAGNOSIS — R748 Abnormal levels of other serum enzymes: Secondary | ICD-10-CM | POA: Diagnosis not present

## 2023-09-14 ENCOUNTER — Ambulatory Visit (HOSPITAL_COMMUNITY)
Admission: RE | Admit: 2023-09-14 | Discharge: 2023-09-14 | Disposition: A | Discharge status: Home / Self Care | Payer: BC Managed Care – PPO | Source: Ambulatory Visit | Attending: Endocrinology | Admitting: Endocrinology

## 2023-09-14 DIAGNOSIS — E139 Other specified diabetes mellitus without complications: Secondary | ICD-10-CM | POA: Insufficient documentation

## 2023-10-11 DIAGNOSIS — E7800 Pure hypercholesterolemia, unspecified: Secondary | ICD-10-CM | POA: Diagnosis not present

## 2023-10-11 DIAGNOSIS — Z125 Encounter for screening for malignant neoplasm of prostate: Secondary | ICD-10-CM | POA: Diagnosis not present

## 2023-10-11 DIAGNOSIS — E1140 Type 2 diabetes mellitus with diabetic neuropathy, unspecified: Secondary | ICD-10-CM | POA: Diagnosis not present

## 2023-10-11 DIAGNOSIS — Z0000 Encounter for general adult medical examination without abnormal findings: Secondary | ICD-10-CM | POA: Diagnosis not present

## 2023-10-11 DIAGNOSIS — E559 Vitamin D deficiency, unspecified: Secondary | ICD-10-CM | POA: Diagnosis not present

## 2023-10-11 DIAGNOSIS — R350 Frequency of micturition: Secondary | ICD-10-CM | POA: Diagnosis not present

## 2024-03-08 DIAGNOSIS — E139 Other specified diabetes mellitus without complications: Secondary | ICD-10-CM | POA: Diagnosis not present

## 2024-03-08 DIAGNOSIS — E7800 Pure hypercholesterolemia, unspecified: Secondary | ICD-10-CM | POA: Diagnosis not present

## 2024-03-08 DIAGNOSIS — R748 Abnormal levels of other serum enzymes: Secondary | ICD-10-CM | POA: Diagnosis not present

## 2024-03-08 DIAGNOSIS — G629 Polyneuropathy, unspecified: Secondary | ICD-10-CM | POA: Diagnosis not present

## 2024-03-29 DIAGNOSIS — R102 Pelvic and perineal pain: Secondary | ICD-10-CM | POA: Diagnosis not present

## 2024-03-29 DIAGNOSIS — K644 Residual hemorrhoidal skin tags: Secondary | ICD-10-CM | POA: Diagnosis not present

## 2024-05-01 DIAGNOSIS — N401 Enlarged prostate with lower urinary tract symptoms: Secondary | ICD-10-CM | POA: Diagnosis not present

## 2024-05-01 DIAGNOSIS — R3915 Urgency of urination: Secondary | ICD-10-CM | POA: Diagnosis not present

## 2024-05-01 DIAGNOSIS — R3912 Poor urinary stream: Secondary | ICD-10-CM | POA: Diagnosis not present

## 2024-05-01 DIAGNOSIS — R102 Pelvic and perineal pain: Secondary | ICD-10-CM | POA: Diagnosis not present

## 2024-09-12 DIAGNOSIS — E7800 Pure hypercholesterolemia, unspecified: Secondary | ICD-10-CM | POA: Diagnosis not present

## 2024-09-12 DIAGNOSIS — R748 Abnormal levels of other serum enzymes: Secondary | ICD-10-CM | POA: Diagnosis not present

## 2024-09-12 DIAGNOSIS — G629 Polyneuropathy, unspecified: Secondary | ICD-10-CM | POA: Diagnosis not present

## 2024-09-12 DIAGNOSIS — E139 Other specified diabetes mellitus without complications: Secondary | ICD-10-CM | POA: Diagnosis not present

## 2024-09-26 DIAGNOSIS — E7800 Pure hypercholesterolemia, unspecified: Secondary | ICD-10-CM | POA: Diagnosis not present

## 2024-09-26 DIAGNOSIS — G629 Polyneuropathy, unspecified: Secondary | ICD-10-CM | POA: Diagnosis not present

## 2024-09-26 DIAGNOSIS — E139 Other specified diabetes mellitus without complications: Secondary | ICD-10-CM | POA: Diagnosis not present

## 2024-09-26 DIAGNOSIS — R748 Abnormal levels of other serum enzymes: Secondary | ICD-10-CM | POA: Diagnosis not present

## 2024-10-16 DIAGNOSIS — Z125 Encounter for screening for malignant neoplasm of prostate: Secondary | ICD-10-CM | POA: Diagnosis not present

## 2024-10-16 DIAGNOSIS — E7800 Pure hypercholesterolemia, unspecified: Secondary | ICD-10-CM | POA: Diagnosis not present

## 2024-10-16 DIAGNOSIS — E559 Vitamin D deficiency, unspecified: Secondary | ICD-10-CM | POA: Diagnosis not present

## 2024-10-16 DIAGNOSIS — Z0000 Encounter for general adult medical examination without abnormal findings: Secondary | ICD-10-CM | POA: Diagnosis not present

## 2024-10-16 DIAGNOSIS — Z23 Encounter for immunization: Secondary | ICD-10-CM | POA: Diagnosis not present

## 2024-10-23 DIAGNOSIS — E139 Other specified diabetes mellitus without complications: Secondary | ICD-10-CM | POA: Diagnosis not present

## 2024-10-23 DIAGNOSIS — Z4681 Encounter for fitting and adjustment of insulin pump: Secondary | ICD-10-CM | POA: Diagnosis not present
